# Patient Record
Sex: Male | Born: 1995 | Race: White | Hispanic: No | Marital: Single | Smoking: Never smoker
Health system: Southern US, Community
[De-identification: ages and names within clinical notes are randomized; demographics above are authoritative.]

## PROBLEM LIST (undated history)

## (undated) DIAGNOSIS — J45909 Unspecified asthma, uncomplicated: Secondary | ICD-10-CM

## (undated) DIAGNOSIS — D691 Qualitative platelet defects: Secondary | ICD-10-CM

---

## 2016-09-08 ENCOUNTER — Emergency Department
Admission: EM | Admit: 2016-09-08 | Discharge: 2016-09-08 | Disposition: A | Discharge status: Home / Self Care | Payer: 59 | Attending: Emergency Medicine | Admitting: Emergency Medicine

## 2016-09-08 ENCOUNTER — Encounter: Payer: Self-pay | Admitting: Emergency Medicine

## 2016-09-08 DIAGNOSIS — B349 Viral infection, unspecified: Secondary | ICD-10-CM | POA: Insufficient documentation

## 2016-09-08 DIAGNOSIS — J45909 Unspecified asthma, uncomplicated: Secondary | ICD-10-CM | POA: Diagnosis not present

## 2016-09-08 DIAGNOSIS — R42 Dizziness and giddiness: Secondary | ICD-10-CM | POA: Diagnosis present

## 2016-09-08 HISTORY — DX: Unspecified asthma, uncomplicated: J45.909

## 2016-09-08 HISTORY — DX: Qualitative platelet defects: D69.1

## 2016-09-08 MED ORDER — ACETAMINOPHEN 500 MG PO TABS
ORAL_TABLET | ORAL | Status: AC
  Filled 2016-09-08: qty 2

## 2016-09-08 MED ORDER — ONDANSETRON 4 MG PO TBDP
4.0000 mg | ORAL_TABLET | Freq: Once | ORAL | Status: AC
  Administered 2016-09-08: 4 mg via ORAL

## 2016-09-08 MED ORDER — ACETAMINOPHEN 500 MG PO TABS
1000.0000 mg | ORAL_TABLET | Freq: Once | ORAL | Status: AC
  Administered 2016-09-08: 1000 mg via ORAL

## 2016-09-08 MED ORDER — ONDANSETRON HCL 4 MG PO TABS: 4.0000 mg | ORAL_TABLET | Freq: Three times a day (TID) | ORAL | 0 refills | Status: DC | PRN

## 2016-09-08 MED ORDER — ONDANSETRON 4 MG PO TBDP
ORAL_TABLET | ORAL | Status: AC
  Filled 2016-09-08: qty 1

## 2016-09-08 NOTE — ED Triage Notes (Signed)
C/O dizziness, stomach pain, "burning throat" onset of symptoms today.

## 2016-09-08 NOTE — ED Provider Notes (Signed)
[facility] Emergency Department Provider Note   ____________________________________________   I have reviewed the triage vital signs and the nursing notes.   HISTORY  Chief Complaint Abdominal Pain and Dizziness   History limited by: Not Limited   HPI Antonio Kaufman is a 21 y.o. male who presents to the emergency department today because of concerns for some abdominal pain, dizziness, fevers. States the symptoms started this morning. There have been persistent throughout the day. Patient states that he has had a hard time keeping down any fluids because of nausea and vomiting. Patient denies any known sick contacts.   Past Medical History:  Diagnosis Date  . Asthma   . Platelet disorder (HCC)     There are no active problems to display for this patient.   History reviewed. No pertinent surgical history.  Prior to Admission medications   Not on File    Allergies Motrin [ibuprofen]  No family history on file.  Social History Social History  Substance Use Topics  . Smoking status: Never Smoker  . Smokeless tobacco: Never Used  . Alcohol use No    Review of Systems  Constitutional: Positive for fever. Cardiovascular: Negative for chest pain. Respiratory: Negative for shortness of breath. Gastrointestinal: Positive for abdominal pain. Neurological: Negative for headaches, focal weakness or numbness.  10-point ROS otherwise negative.  ____________________________________________   PHYSICAL EXAM:  VITAL SIGNS: ED Triage Vitals  Enc Vitals Group     BP 09/08/16 1845 132/76     Pulse Rate 09/08/16 1845 (!) 111     Resp 09/08/16 1845 18     Temp 09/08/16 1845 (!) 100.4 F (38 C)     Temp Source 09/08/16 1845 Oral     SpO2 --      Weight 09/08/16 1842 163 lb (73.9 kg)     Height 09/08/16 1842 6\' 2"  (1.88 m)     Head Circumference --      Peak Flow --      Pain Score 09/08/16 1843 4   Constitutional: Alert and oriented. Well  appearing and in no distress. Eyes: Conjunctivae are normal. Normal extraocular movements. ENT   Head: Normocephalic and atraumatic.   Nose: No congestion/rhinnorhea.   Mouth/Throat: Mucous membranes are moist.   Neck: No stridor. Hematological/Lymphatic/Immunilogical: No cervical lymphadenopathy. Cardiovascular: Tachycardic, regular rhythm.  No murmurs, rubs, or gallops.  Respiratory: Normal respiratory effort without tachypnea nor retractions. Breath sounds are clear and equal bilaterally. No wheezes/rales/rhonchi. Gastrointestinal: Soft and non tender. No rebound. No guarding.  Genitourinary: Deferred Musculoskeletal: Normal range of motion in all extremities. No lower extremity edema. Neurologic:  Normal speech and language. No gross focal neurologic deficits are appreciated.  Skin:  Skin is warm, dry and intact. No rash noted. Psychiatric: Mood and affect are normal. Speech and behavior are normal. Patient exhibits appropriate insight and judgment.  ____________________________________________    LABS (pertinent positives/negatives)  Labs Reviewed - No data to display   ____________________________________________   EKG  None  ____________________________________________    RADIOLOGY  None  ____________________________________________   PROCEDURES  Procedures  ____________________________________________   INITIAL IMPRESSION / ASSESSMENT AND PLAN / ED COURSE  Pertinent labs & imaging results that were available during my care of the patient were reviewed by me and considered in my medical decision making (see chart for details).  Patient presents to emergency department today for viral type illnesses. Patient was given medication here. Patient did defervesce. He was able to tolerate by mouth. She was  easily able to ambulatory around the emergency department. At this point the patient still slightly tachycardic given the fact that he is able to  tolerate by mouth I think he can continue to hydrate at home.  ____________________________________________   FINAL CLINICAL IMPRESSION(S) / ED DIAGNOSES  Final diagnoses:  Viral illness     Note: This dictation was prepared with Dragon dictation. Any transcriptional errors that result from this process are unintentional     Phineas SemenGraydon Lazarus [place], MD 09/08/16 2237

## 2016-09-08 NOTE — ED Notes (Signed)
Patient tolerating PO challenge well with no difficulty.

## 2016-09-08 NOTE — Discharge Instructions (Signed)
Please seek medical attention for any high fevers, chest pain, shortness of breath, change in behavior, persistent vomiting, bloody stool or any other new or concerning symptoms.  

## 2018-04-26 ENCOUNTER — Emergency Department
Admission: EM | Admit: 2018-04-26 | Discharge: 2018-04-26 | Disposition: A | Discharge status: Home / Self Care | Payer: 59 | Attending: Emergency Medicine | Admitting: Emergency Medicine

## 2018-04-26 ENCOUNTER — Other Ambulatory Visit: Payer: Self-pay

## 2018-04-26 DIAGNOSIS — J45909 Unspecified asthma, uncomplicated: Secondary | ICD-10-CM | POA: Diagnosis not present

## 2018-04-26 DIAGNOSIS — F410 Panic disorder [episodic paroxysmal anxiety] without agoraphobia: Secondary | ICD-10-CM

## 2018-04-26 LAB — COMPREHENSIVE METABOLIC PANEL
ALBUMIN: 5.4 g/dL — AB (ref 3.5–5.0)
ALK PHOS: 62 U/L (ref 38–126)
ALT: 19 U/L (ref 0–44)
ANION GAP: 11 (ref 5–15)
AST: 24 U/L (ref 15–41)
BUN: 9 mg/dL (ref 6–20)
CALCIUM: 9.8 mg/dL (ref 8.9–10.3)
CHLORIDE: 106 mmol/L (ref 98–111)
CO2: 24 mmol/L (ref 22–32)
Creatinine, Ser: 0.69 mg/dL (ref 0.61–1.24)
GFR calc Af Amer: >60 mL/min (ref >60)
GFR calc non Af Amer: >60 mL/min (ref >60)
GLUCOSE: 106 mg/dL — AB (ref 70–99)
Potassium: 3.9 mmol/L (ref 3.5–5.1)
SODIUM: 141 mmol/L (ref 135–145)
Total Bilirubin: 0.7 mg/dL (ref 0.3–1.2)
Total Protein: 7.9 g/dL (ref 6.5–8.1)

## 2018-04-26 LAB — CBC
HEMATOCRIT: 44.2 % (ref 40.0–52.0)
HEMOGLOBIN: 15.2 g/dL (ref 13.0–18.0)
MCH: 28.7 pg (ref 26.0–34.0)
MCHC: 34.3 g/dL (ref 32.0–36.0)
MCV: 83.6 fL (ref 80.0–100.0)
PLATELETS: 82 10*3/uL — AB (ref 150–440)
RBC: 5.29 MIL/uL (ref 4.40–5.90)
RDW: 14.8 % — ABNORMAL HIGH (ref 11.5–14.5)
WBC: 11.8 10*3/uL — ABNORMAL HIGH (ref 3.8–10.6)

## 2018-04-26 LAB — URINE DRUG SCREEN, QUALITATIVE (ARMC ONLY)
Amphetamines, Ur Screen: NOT DETECTED
Barbiturates, Ur Screen: NOT DETECTED
Benzodiazepine, Ur Scrn: NOT DETECTED
CANNABINOID 50 NG, UR ~~LOC~~: NOT DETECTED
COCAINE METABOLITE, UR ~~LOC~~: NOT DETECTED
MDMA (ECSTASY) UR SCREEN: NOT DETECTED
Methadone Scn, Ur: NOT DETECTED
Opiate, Ur Screen: NOT DETECTED
PHENCYCLIDINE (PCP) UR S: NOT DETECTED
Tricyclic, Ur Screen: NOT DETECTED

## 2018-04-26 LAB — ETHANOL: Alcohol, Ethyl (B): <10 mg/dL (ref <10)

## 2018-04-26 MED ORDER — LORAZEPAM 0.5 MG PO TABS
0.5000 mg | ORAL_TABLET | Freq: Once | ORAL | Status: AC
  Administered 2018-04-26: 0.5 mg via ORAL
  Filled 2018-04-26: qty 1

## 2018-04-26 NOTE — ED Notes (Signed)
Pt. Transferred from Triage to room after dressing out and screening for contraband. Pt. Oriented to Quad including Q15 minute rounds as well as Rover and Officer for their protection. Patient is alert and oriented, warm and dry in no acute distress. Patient denies SI, HI, and AVH. Pt. Encouraged to let me know if needs arise.  
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
## 2018-04-26 NOTE — Discharge Instructions (Addendum)
Please seek medical attention and help for any thoughts about wanting to harm yourself, harm others, any concerning change in behavior, severe depression, inappropriate drug use or any other new or concerning symptoms. ° °
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
## 2018-04-26 NOTE — ED Notes (Addendum)
Pt has wallet (denies having any money on him), cell phone, orange t-shirt, blue shorts, black tennis shoes, black socks, black underwear.  No jewelry.  Pt glasses are remaining with pt.   Very cooperative during dress out process.

## 2018-04-26 NOTE — ED Notes (Signed)
Hourly rounding reveals patient sitting on 20 hall bed. No complaints, stable, in no acute distress. Q15 minute rounds and monitoring via Psychologist, counselling to continue.

## 2019-11-17 ENCOUNTER — Other Ambulatory Visit: Payer: Self-pay

## 2019-11-17 DIAGNOSIS — Z20822 Contact with and (suspected) exposure to covid-19: Secondary | ICD-10-CM | POA: Diagnosis present

## 2019-11-17 DIAGNOSIS — D696 Thrombocytopenia, unspecified: Secondary | ICD-10-CM | POA: Diagnosis present

## 2019-11-17 DIAGNOSIS — Z833 Family history of diabetes mellitus: Secondary | ICD-10-CM

## 2019-11-17 DIAGNOSIS — R778 Other specified abnormalities of plasma proteins: Secondary | ICD-10-CM | POA: Diagnosis present

## 2019-11-17 DIAGNOSIS — I409 Acute myocarditis, unspecified: Principal | ICD-10-CM | POA: Diagnosis present

## 2019-11-17 DIAGNOSIS — J45909 Unspecified asthma, uncomplicated: Secondary | ICD-10-CM | POA: Diagnosis present

## 2019-11-18 ENCOUNTER — Inpatient Hospital Stay
Admit: 2019-11-18 | Discharge: 2019-11-18 | Disposition: A | Discharge status: Home / Self Care | Payer: 59 | Attending: Physician Assistant | Admitting: Physician Assistant

## 2019-11-18 ENCOUNTER — Emergency Department: Payer: 59

## 2019-11-18 ENCOUNTER — Inpatient Hospital Stay
Admission: EM | Admit: 2019-11-18 | Discharge: 2019-11-18 | DRG: 316 | Disposition: A | Discharge status: Other Acute Inpatient Hospital | Payer: 59 | Attending: Internal Medicine | Admitting: Internal Medicine

## 2019-11-18 ENCOUNTER — Other Ambulatory Visit: Payer: Self-pay

## 2019-11-18 ENCOUNTER — Encounter: Payer: Self-pay | Admitting: Emergency Medicine

## 2019-11-18 DIAGNOSIS — Z20822 Contact with and (suspected) exposure to covid-19: Secondary | ICD-10-CM | POA: Diagnosis present

## 2019-11-18 DIAGNOSIS — I409 Acute myocarditis, unspecified: Secondary | ICD-10-CM | POA: Diagnosis present

## 2019-11-18 DIAGNOSIS — R079 Chest pain, unspecified: Secondary | ICD-10-CM

## 2019-11-18 DIAGNOSIS — R778 Other specified abnormalities of plasma proteins: Secondary | ICD-10-CM | POA: Diagnosis present

## 2019-11-18 DIAGNOSIS — I400 Infective myocarditis: Secondary | ICD-10-CM | POA: Diagnosis not present

## 2019-11-18 DIAGNOSIS — D696 Thrombocytopenia, unspecified: Secondary | ICD-10-CM | POA: Diagnosis present

## 2019-11-18 DIAGNOSIS — J45909 Unspecified asthma, uncomplicated: Secondary | ICD-10-CM

## 2019-11-18 DIAGNOSIS — Z833 Family history of diabetes mellitus: Secondary | ICD-10-CM | POA: Diagnosis not present

## 2019-11-18 LAB — COMPREHENSIVE METABOLIC PANEL
ALT: 21 U/L (ref 0–44)
AST: 31 U/L (ref 15–41)
Albumin: 4.7 g/dL (ref 3.5–5.0)
Alkaline Phosphatase: 67 U/L (ref 38–126)
Anion gap: 11 (ref 5–15)
BUN: 13 mg/dL (ref 6–20)
CO2: 27 mmol/L (ref 22–32)
Calcium: 9.5 mg/dL (ref 8.9–10.3)
Chloride: 100 mmol/L (ref 98–111)
Creatinine, Ser: 0.89 mg/dL (ref 0.61–1.24)
GFR calc Af Amer: >60 mL/min (ref >60)
GFR calc non Af Amer: >60 mL/min (ref >60)
Glucose, Bld: 115 mg/dL — ABNORMAL HIGH (ref 70–99)
Potassium: 3.7 mmol/L (ref 3.5–5.1)
Sodium: 138 mmol/L (ref 135–145)
Total Bilirubin: 0.6 mg/dL (ref 0.3–1.2)
Total Protein: 7.6 g/dL (ref 6.5–8.1)

## 2019-11-18 LAB — BASIC METABOLIC PANEL
Anion gap: 8 (ref 5–15)
BUN: 10 mg/dL (ref 6–20)
CO2: 27 mmol/L (ref 22–32)
Calcium: 9.1 mg/dL (ref 8.9–10.3)
Chloride: 104 mmol/L (ref 98–111)
Creatinine, Ser: 0.77 mg/dL (ref 0.61–1.24)
GFR calc Af Amer: >60 mL/min (ref >60)
GFR calc non Af Amer: >60 mL/min (ref >60)
Glucose, Bld: 133 mg/dL — ABNORMAL HIGH (ref 70–99)
Potassium: 3.7 mmol/L (ref 3.5–5.1)
Sodium: 139 mmol/L (ref 135–145)

## 2019-11-18 LAB — LIPID PANEL
Cholesterol: 151 mg/dL (ref 0–200)
HDL: 57 mg/dL (ref >40)
LDL Cholesterol: 87 mg/dL (ref 0–99)
Total CHOL/HDL Ratio: 2.6 RATIO
Triglycerides: 33 mg/dL (ref <150)
VLDL: 7 mg/dL (ref 0–40)

## 2019-11-18 LAB — CBC WITH DIFFERENTIAL/PLATELET
Abs Immature Granulocytes: 0.04 10*3/uL (ref 0.00–0.07)
Basophils Absolute: 0 10*3/uL (ref 0.0–0.1)
Basophils Relative: 0 %
Eosinophils Absolute: 0.1 10*3/uL (ref 0.0–0.5)
Eosinophils Relative: 1 %
HCT: 39.9 % (ref 39.0–52.0)
Hemoglobin: 13.2 g/dL (ref 13.0–17.0)
Immature Granulocytes: 0 %
Lymphocytes Relative: 23 %
Lymphs Abs: 2.3 10*3/uL (ref 0.7–4.0)
MCH: 28.2 pg (ref 26.0–34.0)
MCHC: 33.1 g/dL (ref 30.0–36.0)
MCV: 85.3 fL (ref 80.0–100.0)
Monocytes Absolute: 0.9 10*3/uL (ref 0.1–1.0)
Monocytes Relative: 9 %
Neutro Abs: 6.6 10*3/uL (ref 1.7–7.7)
Neutrophils Relative %: 67 %
Platelets: 80 10*3/uL — ABNORMAL LOW (ref 150–400)
RBC: 4.68 MIL/uL (ref 4.22–5.81)
RDW: 13.5 % (ref 11.5–15.5)
WBC: 10 10*3/uL (ref 4.0–10.5)
nRBC: 0 % (ref 0.0–0.2)

## 2019-11-18 LAB — CBC
HCT: 38.9 % — ABNORMAL LOW (ref 39.0–52.0)
Hemoglobin: 12.9 g/dL — ABNORMAL LOW (ref 13.0–17.0)
MCH: 28.3 pg (ref 26.0–34.0)
MCHC: 33.2 g/dL (ref 30.0–36.0)
MCV: 85.3 fL (ref 80.0–100.0)
Platelets: 81 10*3/uL — ABNORMAL LOW (ref 150–400)
RBC: 4.56 MIL/uL (ref 4.22–5.81)
RDW: 13.7 % (ref 11.5–15.5)
WBC: 11.7 10*3/uL — ABNORMAL HIGH (ref 4.0–10.5)
nRBC: 0 % (ref 0.0–0.2)

## 2019-11-18 LAB — ECHOCARDIOGRAM COMPLETE
Height: 74 in
Weight: 3039.98 oz

## 2019-11-18 LAB — URINE DRUG SCREEN, QUALITATIVE (ARMC ONLY)
Amphetamines, Ur Screen: NOT DETECTED
Barbiturates, Ur Screen: NOT DETECTED
Benzodiazepine, Ur Scrn: NOT DETECTED
Cannabinoid 50 Ng, Ur ~~LOC~~: NOT DETECTED
Cocaine Metabolite,Ur ~~LOC~~: NOT DETECTED
MDMA (Ecstasy)Ur Screen: NOT DETECTED
Methadone Scn, Ur: NOT DETECTED
Opiate, Ur Screen: POSITIVE — AB
Phencyclidine (PCP) Ur S: NOT DETECTED
Tricyclic, Ur Screen: NOT DETECTED

## 2019-11-18 LAB — PROTIME-INR
INR: 1 (ref 0.8–1.2)
Prothrombin Time: 13.4 seconds (ref 11.4–15.2)

## 2019-11-18 LAB — CULTURE, BLOOD (ROUTINE X 2): Culture: NO GROWTH

## 2019-11-18 LAB — TROPONIN I (HIGH SENSITIVITY)
Troponin I (High Sensitivity): 2554 ng/L (ref <18)
Troponin I (High Sensitivity): 3953 ng/L (ref <18)
Troponin I (High Sensitivity): 6726 ng/L (ref <18)
Troponin I (High Sensitivity): 7452 ng/L (ref <18)

## 2019-11-18 LAB — HEPARIN LEVEL (UNFRACTIONATED)
Heparin Unfractionated: 0.6 IU/mL (ref 0.30–0.70)
Heparin Unfractionated: 1.61 IU/mL — ABNORMAL HIGH (ref 0.30–0.70)

## 2019-11-18 LAB — RESPIRATORY PANEL BY RT PCR (FLU A&B, COVID)
Influenza A by PCR: NEGATIVE
Influenza B by PCR: NEGATIVE
SARS Coronavirus 2 by RT PCR: NEGATIVE

## 2019-11-18 LAB — C-REACTIVE PROTEIN: CRP: 2.2 mg/dL — ABNORMAL HIGH (ref <1.0)

## 2019-11-18 LAB — HIV ANTIBODY (ROUTINE TESTING W REFLEX): HIV Screen 4th Generation wRfx: NONREACTIVE

## 2019-11-18 LAB — APTT: aPTT: 26 seconds (ref 24–36)

## 2019-11-18 MED ORDER — MORPHINE SULFATE (PF) 2 MG/ML IV SOLN
1.0000 mg | Freq: Once | INTRAVENOUS | Status: AC
  Administered 2019-11-18: 1 mg via INTRAVENOUS
  Filled 2019-11-18: qty 1

## 2019-11-18 MED ORDER — ALPRAZOLAM 0.5 MG PO TABS: 0.2500 mg | ORAL_TABLET | Freq: Two times a day (BID) | ORAL | Status: DC | PRN

## 2019-11-18 MED ORDER — VANCOMYCIN HCL 2000 MG/400ML IV SOLN
2000.0000 mg | Freq: Once | INTRAVENOUS | Status: AC
  Administered 2019-11-18: 2000 mg via INTRAVENOUS
  Filled 2019-11-18: qty 400

## 2019-11-18 MED ORDER — HEPARIN BOLUS VIA INFUSION
4000.0000 [IU] | Freq: Once | INTRAVENOUS | Status: AC
  Administered 2019-11-18: 4000 [IU] via INTRAVENOUS
  Filled 2019-11-18: qty 4000

## 2019-11-18 MED ORDER — SODIUM CHLORIDE 0.9 % IV SOLN
2.0000 g | INTRAVENOUS | Status: DC
  Administered 2019-11-18: 2 g via INTRAVENOUS
  Filled 2019-11-18: qty 20

## 2019-11-18 MED ORDER — ONDANSETRON HCL 4 MG/2ML IJ SOLN: 4.0000 mg | Freq: Four times a day (QID) | INTRAMUSCULAR | Status: DC | PRN

## 2019-11-18 MED ORDER — COLCHICINE 0.6 MG PO TABS
0.60 | ORAL_TABLET | ORAL | Status: DC
Start: 2019-11-19 — End: 2019-11-18

## 2019-11-18 MED ORDER — ACETAMINOPHEN 325 MG PO TABS
650.0000 mg | ORAL_TABLET | ORAL | Status: DC | PRN
  Administered 2019-11-18: 650 mg via ORAL
  Filled 2019-11-18: qty 2

## 2019-11-18 MED ORDER — ZOLPIDEM TARTRATE 5 MG PO TABS: 5.0000 mg | ORAL_TABLET | Freq: Every evening | ORAL | Status: DC | PRN

## 2019-11-18 MED ORDER — ASPIRIN EC 81 MG PO TBEC: 81.0000 mg | DELAYED_RELEASE_TABLET | Freq: Every day | ORAL | Status: DC

## 2019-11-18 MED ORDER — MORPHINE SULFATE (PF) 2 MG/ML IV SOLN
INTRAVENOUS | Status: AC
  Filled 2019-11-18: qty 1

## 2019-11-18 MED ORDER — HEPARIN (PORCINE) 25000 UT/250ML-% IV SOLN
1200.0000 [IU]/h | INTRAVENOUS | Status: DC
  Administered 2019-11-18: 1200 [IU]/h via INTRAVENOUS
  Filled 2019-11-18: qty 250

## 2019-11-18 MED ORDER — IOHEXOL 350 MG/ML SOLN
75.0000 mL | Freq: Once | INTRAVENOUS | Status: AC | PRN
Start: 2019-11-18 — End: 2019-11-18
  Administered 2019-11-18: 75 mL via INTRAVENOUS

## 2019-11-18 MED ORDER — SODIUM CHLORIDE 0.9 % IV SOLN: INTRAVENOUS | Status: DC

## 2019-11-18 MED ORDER — MORPHINE SULFATE (PF) 4 MG/ML IV SOLN
4.0000 mg | Freq: Once | INTRAVENOUS | Status: AC
  Administered 2019-11-18: 4 mg via INTRAVENOUS

## 2019-11-18 MED ORDER — ATORVASTATIN CALCIUM 20 MG PO TABS
40.0000 mg | ORAL_TABLET | Freq: Every day | ORAL | Status: DC
  Administered 2019-11-18: 40 mg via ORAL
  Filled 2019-11-18: qty 2

## 2019-11-18 MED ORDER — VANCOMYCIN HCL 2000 MG/400ML IV SOLN
2000.0000 mg | Freq: Two times a day (BID) | INTRAVENOUS | Status: DC
  Filled 2019-11-18 (×2): qty 400

## 2019-11-18 MED ORDER — MORPHINE SULFATE (PF) 4 MG/ML IV SOLN
INTRAVENOUS | Status: AC
  Filled 2019-11-18: qty 1

## 2019-11-18 MED ORDER — PANTOPRAZOLE SODIUM 40 MG PO TBEC
40.0000 mg | DELAYED_RELEASE_TABLET | Freq: Every day | ORAL | Status: DC
  Administered 2019-11-18: 40 mg via ORAL
  Filled 2019-11-18: qty 1

## 2019-11-18 MED ORDER — LIDOCAINE HCL 1 % IJ SOLN
0.50 | INTRAMUSCULAR | Status: DC
Start: ? — End: 2019-11-18

## 2019-11-18 MED ORDER — MORPHINE SULFATE (PF) 2 MG/ML IV SOLN
1.0000 mg | Freq: Once | INTRAVENOUS | Status: AC
  Administered 2019-11-18: 1 mg via INTRAVENOUS

## 2019-11-18 MED ORDER — MORPHINE SULFATE (PF) 2 MG/ML IV SOLN
2.0000 mg | Freq: Once | INTRAVENOUS | Status: AC
  Administered 2019-11-18: 2 mg via INTRAVENOUS
  Filled 2019-11-18: qty 1

## 2019-11-18 MED ORDER — SODIUM CHLORIDE 0.9 % IV SOLN
1.0000 g | Freq: Once | INTRAVENOUS | Status: AC
  Administered 2019-11-18: 1 g via INTRAVENOUS
  Filled 2019-11-18: qty 10

## 2019-11-18 MED ORDER — METHYLPREDNISOLONE SODIUM SUCC 40 MG IJ SOLR
40.0000 mg | Freq: Three times a day (TID) | INTRAMUSCULAR | Status: DC
  Administered 2019-11-18: 40 mg via INTRAVENOUS
  Filled 2019-11-18: qty 1

## 2019-11-18 MED ORDER — HEPARIN (PORCINE) 25000 UT/250ML-% IV SOLN: 1050.0000 [IU]/h | INTRAVENOUS | Status: DC

## 2019-11-18 MED ORDER — METHYLPREDNISOLONE SODIUM SUCC 125 MG IJ SOLR
125.0000 mg | Freq: Once | INTRAMUSCULAR | Status: AC
  Administered 2019-11-18: 125 mg via INTRAVENOUS
  Filled 2019-11-18: qty 2

## 2019-11-18 MED ORDER — NITROGLYCERIN 0.4 MG SL SUBL: 0.4000 mg | SUBLINGUAL_TABLET | SUBLINGUAL | Status: DC | PRN

## 2019-11-18 NOTE — Progress Notes (Signed)
Physician Discharge Summary  [name] LOV:[id number] DOB: 04/15/1996 DOA: 11/18/2019  PCP: System, Pcp Not In  Admit date: 11/18/2019 Discharge date: 11/18/2019  Admitted From: Home Disposition:  [facility]   Discharge Condition:Stable CODE STATUS:FULL Diet recommendation: Heart Healthy  Brief/Interim Summary: HPI: Antonio Kaufman  is a 24 y.o. Caucasian pleasant male with a known history of controlled asthma and chronic thrombocytopenia, presented to the emergency room with acute onset of chest pain in the midsternal area that started at 6 AM and associated left arm pain started 7:30 PM.  He also admitted to neck pain since yesterday.  He felt he had a fever will not check it and denies any cough wheezing.  He admits to dyspnea without palpitations.  He just had his second [employer] vaccine on Sunday and denied any COVID-19 recent exposure.  He stated he had several side effects with that including dizziness, headache, migraine, cough, unsteady gait and balance loss as well as fever and chills with diaphoresis.  Upon presentation to the emergency room, vital signs were within normal.  Labs were remarkable for high-sensitivity troponin I of 2554 and later 3953 with normal CMP and CBC except for thrombocytopenia of 80 comparable to previous levels.  COVID-19 PCR and influenza antigens came back all negative.  2 blood cultures were drawn.  Two-view chest x-ray showed no acute cardiopulmonary disease.  Chest CTA revealed no evidence for PE or acute cardiopulmonary disease.  The patient was given IV Rocephin and vancomycin as well as 2 mg of IV morphine sulfate and hematology consultation was obtained by Dr. Cathie Hoops over the phone who was agreeable with IV heparin bolus and drip.  The patient will be admitted to a progressive cardiac unit bed for further evaluation and management.  Update: The patient was seen and evaluated by cardiology consultant at [facility]  Echocardiogram reviewed.  No cardiac catheterization recommended at this time.  Cardiology consult and discussed the case with cardiologist at [facility].  Recommending transfer to [facility] for advanced cardiac work-up including cardiac MRI to assess degree of inflammation.  Working diagnosis is myocarditis versus myopericarditis.  Cardiology service to arrange for transfer.  Accepting physician at [facility] will be Dr. Ardyth Harps.    Discharge Diagnoses:  Active Problems:   Acute myocarditis  Acute myopericarditis Suspect viral etiology Unable to exclude bacterial etiology at this time Less likely Covid vaccine effect Patient was started on IV steroids in the ED Supplemental pain control with narcotics On IV Rocephin and IV vancomycin 2D echocardiogram completed Troponins trended, last troponin 7400 Transfer to [facility] for advanced cardiac work-up and cardiac MRI  Asthma Stable Not acutely exacerbated  Thrombocytopenia Chronic known issue No bleeding noted Monitor platelets  Discharge Instructions     Allergies  Allergen Reactions  . Motrin [Ibuprofen] Other (See Comments)    Low platlets    Consultations:  Cardiology- [facility]   Procedures/Studies: DG Chest 2 View  Result Date: 11/18/2019 CLINICAL DATA:  Mid chest pain and shortness of breath. EXAM: CHEST - 2 VIEW COMPARISON:  None. FINDINGS: The cardiomediastinal contours are normal. The lungs are clear. Pulmonary vasculature is normal. No consolidation, pleural effusion, or pneumothorax. No acute osseous abnormalities are seen. IMPRESSION: Negative radiographs of the chest. Electronically Signed   By: Narda Rutherford M.D.   On: 11/18/2019 00:25   CT Angio Chest PE W and/or Wo Contrast  Result Date: 11/18/2019 CLINICAL DATA:  Chest pain, shortness  of breath EXAM: CT ANGIOGRAPHY CHEST WITH CONTRAST TECHNIQUE: Multidetector CT imaging of the chest was performed  using the standard protocol during bolus administration of intravenous contrast. Multiplanar CT image reconstructions and MIPs were obtained to evaluate the vascular anatomy. CONTRAST:  75mL OMNIPAQUE IOHEXOL 350 MG/ML SOLN COMPARISON:  None. FINDINGS: Cardiovascular: No filling defects in the pulmonary arteries. Heart is normal size. Aorta is normal caliber. Mediastinum/Nodes: No mediastinal, hilar, or axillary adenopathy. Trachea and esophagus are unremarkable. Thyroid unremarkable. Lungs/Pleura: Lungs are clear. No focal airspace opacities or suspicious nodules. No effusions. Upper Abdomen: Imaging into the upper abdomen shows no acute findings. Musculoskeletal: Chest wall soft tissues are unremarkable. No acute bony abnormality. Review of the MIP images confirms the above findings. IMPRESSION: No acute cardiopulmonary disease. No evidence of pulmonary embolus. Electronically Signed   By: Charlett Nose M.D.   On: 11/18/2019 01:55   ECHOCARDIOGRAM COMPLETE  Result Date: 11/18/2019    ECHOCARDIOGRAM REPORT   Patient Name:   Antonio Kaufman Date of Exam: 11/18/2019 Medical Rec #:  [id number]     Height:       74.0 in Accession #:    [id number]    Weight:       190.0 lb Date of Birth:  1996/05/02      BSA:          2.127 m Patient Age:    24 years      BP:           123/94 mmHg Patient Gender: M             HR:           98 bpm. Exam Location:  [facility] Procedure: 2D Echo, Cardiac Doppler and Color Doppler Indications:     Chest pain 786.50 Elevated troponin R 07.9  History:         Patient has no prior history of Echocardiogram examinations. No                  cardiac history in chart.  Sonographer:     Cristela Blue RDCS (AE) Referring Phys:  [id number] Leanora Ivanoff Diagnosing Phys: Marcina Millard MD IMPRESSIONS  1. Left ventricular ejection fraction, by estimation, is 60 to 65%. The left ventricle has normal function. The left ventricle has no regional wall motion abnormalities. Left ventricular diastolic parameters are  consistent with Grade I diastolic dysfunction (impaired relaxation).  2. Right ventricular systolic function is normal. The right ventricular size is normal. There is normal pulmonary artery systolic pressure.  3. The mitral valve is normal in structure. Mild mitral valve regurgitation. No evidence of mitral stenosis.  4. The aortic valve is normal in structure. Aortic valve regurgitation is not visualized. No aortic stenosis is present.  5. The inferior vena cava is normal in size with greater than 50% respiratory variability, suggesting right atrial pressure of 3 mmHg. FINDINGS  Left Ventricle: Left ventricular ejection fraction, by estimation, is 60 to 65%. The left ventricle has normal function. The left ventricle has no regional wall motion abnormalities. The left ventricular internal cavity size was normal in size. There is  borderline left ventricular hypertrophy. Left ventricular diastolic parameters are consistent with Grade I diastolic dysfunction (impaired relaxation). Right Ventricle: The right ventricular size is normal. No increase in right ventricular wall thickness. Right ventricular systolic function is normal. There is normal pulmonary artery systolic pressure. The tricuspid regurgitant velocity is 1.28 m/s, and  with an assumed right atrial pressure of 10  mmHg, the estimated right ventricular systolic pressure is 16.6 mmHg. Left Atrium: Left atrial size was normal in size. Right Atrium: Right atrial size was normal in size. Pericardium: There is no evidence of pericardial effusion. Mitral Valve: The mitral valve is normal in structure. Normal mobility of the mitral valve leaflets. Mild mitral valve regurgitation. No evidence of mitral valve stenosis. Tricuspid Valve: The tricuspid valve is normal in structure. Tricuspid valve regurgitation is trivial. No evidence of tricuspid stenosis. Aortic Valve: The aortic valve is normal in structure. Aortic valve regurgitation is not visualized. No aortic  stenosis is present. Aortic valve mean gradient measures 1.5 mmHg. Aortic valve peak gradient measures 3.1 mmHg. Aortic valve area, by VTI measures 3.43 cm. Pulmonic Valve: The pulmonic valve was normal in structure. Pulmonic valve regurgitation is not visualized. No evidence of pulmonic stenosis. Aorta: The aortic root is normal in size and structure. Venous: The inferior vena cava is normal in size with greater than 50% respiratory variability, suggesting right atrial pressure of 3 mmHg. IAS/Shunts: No atrial level shunt detected by color flow Doppler.  LEFT VENTRICLE PLAX 2D LVIDd:         4.48 cm  Diastology LVIDs:         2.86 cm  LV e' lateral:   8.05 cm/s LV PW:         1.24 cm  LV E/e' lateral: 9.1 LV IVS:        0.98 cm  LV e' medial:    9.14 cm/s LVOT diam:     2.10 cm  LV E/e' medial:  8.0 LV SV:         49 LV SV Index:   23 LVOT Area:     3.46 cm  RIGHT VENTRICLE RV S prime:     15.60 cm/s TAPSE (M-mode): 3.1 cm LEFT ATRIUM             Index       RIGHT ATRIUM           Index LA diam:        2.70 cm 1.27 cm/m  RA Area:     13.50 cm LA Vol (A2C):   43.8 ml 20.60 ml/m RA Volume:   35.20 ml  16.55 ml/m LA Vol (A4C):   23.1 ml 10.86 ml/m LA Biplane Vol: 32.8 ml 15.42 ml/m  AORTIC VALVE                   PULMONIC VALVE AV Area (Vmax):    3.48 cm    PV Vmax:        1.00 m/s AV Area (Vmean):   3.28 cm    PV Peak grad:   4.0 mmHg AV Area (VTI):     3.43 cm    RVOT Peak grad: 4 mmHg AV Vmax:           87.50 cm/s AV Vmean:          61.900 cm/s AV VTI:            0.144 m AV Peak Grad:      3.1 mmHg AV Mean Grad:      1.5 mmHg LVOT Vmax:         87.90 cm/s LVOT Vmean:        58.700 cm/s LVOT VTI:          0.142 m LVOT/AV VTI ratio: 0.99  AORTA Ao Root diam: 3.40 cm MITRAL VALVE  TRICUSPID VALVE MV Area (PHT): 9.85 cm    TR Peak grad:   6.6 mmHg MV Decel Time: 77 msec     TR Vmax:        128.00 cm/s MV E velocity: 73.20 cm/s MV A velocity: 83.90 cm/s  SHUNTS MV E/A ratio:  0.87         Systemic VTI:  0.14 m                            Systemic Diam: 2.10 cm Marcina Millard MD Electronically signed by Marcina Millard MD Signature Date/Time: 11/18/2019/12:36:19 PM    Final     (Echo, Carotid, EGD, Colonoscopy, ERCP)    Subjective: Seen and examined Still with some chest pain Concerned about cardiac damage  Discharge Exam: Vitals:   11/18/19 1130 11/18/19 1215  BP: 118/83   Pulse: 98 (!) 101  Resp: 18 (!) 24  Temp:    SpO2: 96% 97%   Vitals:   11/18/19 1030 11/18/19 1100 11/18/19 1130 11/18/19 1215  BP: (!) 123/94 123/84 118/83   Pulse: 98 (!) 106 98 (!) 101  Resp: (!) 21 18 18  (!) 24  Temp:      TempSrc:      SpO2: 98% 98% 96% 97%  Weight:      Height:        General: Pt is alert, awake, not in acute distress Cardiovascular: RRR, S1/S2 +, no rubs, no gallops Respiratory: CTA bilaterally, no wheezing, no rhonchi Abdominal: Soft, NT, ND, bowel sounds + Extremities: no edema, no cyanosis    The results of significant diagnostics from this hospitalization (including imaging, microbiology, ancillary and laboratory) are listed below for reference.     Microbiology: Recent Results (from the past 240 hour(s))  Blood culture (routine x 2)     Status: None (Preliminary result)   Collection Time: 11/18/19  1:09 AM   Specimen: BLOOD  Result Value Ref Range Status   Specimen Description BLOOD LAC  Final   Special Requests BOTTLES DRAWN AEROBIC AND ANAEROBIC BCAV  Final   Culture   Final    NO GROWTH < 12 HOURS Performed at [facility], [address] [place], [place]    Report Status PENDING  Incomplete  Blood culture (routine x 2)     Status: None (Preliminary result)   Collection Time: 11/18/19  1:09 AM   Specimen: BLOOD  Result Value Ref Range Status   Specimen Description BLOOD L HAND  Final   Special Requests BOTTLES DRAWN AEROBIC AND ANAEROBIC BCAV  Final   Culture   Final    NO GROWTH < 12 HOURS Performed at  [facility], [address], [place], [place]    Report Status PENDING  Incomplete  Respiratory Panel by RT PCR (Flu A&B, Covid) - Nasopharyngeal Swab     Status: None   Collection Time: 11/18/19  2:20 AM   Specimen: Nasopharyngeal Swab  Result Value Ref Range Status   SARS Coronavirus 2 by RT PCR NEGATIVE NEGATIVE Final    Comment: (NOTE) SARS-CoV-2 target nucleic acids are NOT DETECTED. The SARS-CoV-2 RNA is generally detectable in upper respiratoy specimens during the acute phase of infection. The lowest concentration of SARS-CoV-2 viral copies this assay can detect is 131 copies/mL. A negative result does not preclude SARS-Cov-2 infection and should not be used as the sole basis for treatment or other patient management decisions. A  negative result may occur with  improper specimen collection/handling, submission of specimen other than nasopharyngeal swab, presence of viral mutation(s) within the areas targeted by this assay, and inadequate number of viral copies (<131 copies/mL). A negative result must be combined with clinical observations, patient history, and epidemiological information. The expected result is Negative. Fact Sheet for Patients:  https://www.moore.com/ Fact Sheet for Healthcare Providers:  https://www.young.biz/ This test is not yet ap proved or cleared by the [place] FDA and  has been authorized for detection and/or diagnosis of SARS-CoV-2 by FDA under an Emergency Use Authorization (EUA). This EUA will remain  in effect (meaning this test can be used) for the duration of the COVID-19 declaration under Section 564(b)(1) of the Act, 21 U.S.C. section 360bbb-3(b)(1), unless the authorization is terminated or revoked sooner.    Influenza A by PCR NEGATIVE NEGATIVE Final   Influenza B by PCR NEGATIVE NEGATIVE Final    Comment: (NOTE) The Xpert Xpress SARS-CoV-2/FLU/RSV assay is intended as an  aid in  the diagnosis of influenza from Nasopharyngeal swab specimens and  should not be used as a sole basis for treatment. Nasal washings and  aspirates are unacceptable for Xpert Xpress SARS-CoV-2/FLU/RSV  testing. Fact Sheet for Patients: https://www.moore.com/ Fact Sheet for Healthcare Providers: https://www.young.biz/ This test is not yet approved or cleared by the [place] FDA and  has been authorized for detection and/or diagnosis of SARS-CoV-2 by  FDA under an Emergency Use Authorization (EUA). This EUA will remain  in effect (meaning this test can be used) for the duration of the  Covid-19 declaration under Section 564(b)(1) of the Act, 21  U.S.C. section 360bbb-3(b)(1), unless the authorization is  terminated or revoked. Performed at [facility], [address] [place], [place] [address]      Labs: BNP (last 3 results) No results for input(s): BNP in the last 8760 hours. Basic Metabolic Panel: Recent Labs  Lab 11/17/19 2359 11/18/19 0442  NA 138 139  K 3.7 3.7  CL 100 104  CO2 27 27  GLUCOSE 115* 133*  BUN 13 10  CREATININE 0.89 0.77  CALCIUM 9.5 9.1   Liver Function Tests: Recent Labs  Lab 11/17/19 2359  AST 31  ALT 21  ALKPHOS 67  BILITOT 0.6  PROT 7.6  ALBUMIN 4.7   No results for input(s): LIPASE, AMYLASE in the last 168 hours. No results for input(s): AMMONIA in the last 168 hours. CBC: Recent Labs  Lab 11/17/19 2359 11/18/19 0442  WBC 10.0 11.7*  NEUTROABS 6.6  --   HGB 13.2 12.9*  HCT 39.9 38.9*  MCV 85.3 85.3  PLT 80* 81*   Cardiac Enzymes: No results for input(s): CKTOTAL, CKMB, CKMBINDEX, TROPONINI in the last 168 hours. BNP: Invalid input(s): POCBNP CBG: No results for input(s): GLUCAP in the last 168 hours. D-Dimer No results for input(s): DDIMER in the last 72 hours. Hgb A1c No results for input(s): HGBA1C in the last 72 hours. Lipid Profile Recent Labs     11/18/19 0442  CHOL 151  HDL 57  LDLCALC 87  TRIG 33  CHOLHDL 2.6   Thyroid function studies No results for input(s): TSH, T4TOTAL, T3FREE, THYROIDAB in the last 72 hours.  Invalid input(s): FREET3 Anemia work up No results for input(s): VITAMINB12, FOLATE, FERRITIN, TIBC, IRON, RETICCTPCT in the last 72 hours. Urinalysis No results found for: COLORURINE, APPEARANCEUR, LABSPEC, PHURINE, GLUCOSEU, HGBUR, BILIRUBINUR, KETONESUR, PROTEINUR, UROBILINOGEN, NITRITE, LEUKOCYTESUR Sepsis Labs Invalid input(s): PROCALCITONIN,  WBC,  LACTICIDVEN  Microbiology Recent Results (from the past 240 hour(s))  Blood culture (routine x 2)     Status: None (Preliminary result)   Collection Time: 11/18/19  1:09 AM   Specimen: BLOOD  Result Value Ref Range Status   Specimen Description BLOOD LAC  Final   Special Requests BOTTLES DRAWN AEROBIC AND ANAEROBIC BCAV  Final   Culture   Final    NO GROWTH < 12 HOURS Performed at [facility], [address] [place], [place] [address]    Report Status PENDING  Incomplete  Blood culture (routine x 2)     Status: None (Preliminary result)   Collection Time: 11/18/19  1:09 AM   Specimen: BLOOD  Result Value Ref Range Status   Specimen Description BLOOD L HAND  Final   Special Requests BOTTLES DRAWN AEROBIC AND ANAEROBIC BCAV  Final   Culture   Final    NO GROWTH < 12 HOURS Performed at [facility], [address], [place], [place] [address]    Report Status PENDING  Incomplete  Respiratory Panel by RT PCR (Flu A&B, Covid) - Nasopharyngeal Swab     Status: None   Collection Time: 11/18/19  2:20 AM   Specimen: Nasopharyngeal Swab  Result Value Ref Range Status   SARS Coronavirus 2 by RT PCR NEGATIVE NEGATIVE Final    Comment: (NOTE) SARS-CoV-2 target nucleic acids are NOT DETECTED. The SARS-CoV-2 RNA is generally detectable in upper respiratoy specimens during the acute phase of infection. The lowest concentration of SARS-CoV-2  viral copies this assay can detect is 131 copies/mL. A negative result does not preclude SARS-Cov-2 infection and should not be used as the sole basis for treatment or other patient management decisions. A negative result may occur with  improper specimen collection/handling, submission of specimen other than nasopharyngeal swab, presence of viral mutation(s) within the areas targeted by this assay, and inadequate number of viral copies (<131 copies/mL). A negative result must be combined with clinical observations, patient history, and epidemiological information. The expected result is Negative. Fact Sheet for Patients:  PinkCheek.be Fact Sheet for Healthcare Providers:  GravelBags.it This test is not yet ap proved or cleared by the [place] FDA and  has been authorized for detection and/or diagnosis of SARS-CoV-2 by FDA under an Emergency Use Authorization (EUA). This EUA will remain  in effect (meaning this test can be used) for the duration of the COVID-19 declaration under Section 564(b)(1) of the Act, 21 U.S.C. section 360bbb-3(b)(1), unless the authorization is terminated or revoked sooner.    Influenza A by PCR NEGATIVE NEGATIVE Final   Influenza B by PCR NEGATIVE NEGATIVE Final    Comment: (NOTE) The Xpert Xpress SARS-CoV-2/FLU/RSV assay is intended as an aid in  the diagnosis of influenza from Nasopharyngeal swab specimens and  should not be used as a sole basis for treatment. Nasal washings and  aspirates are unacceptable for Xpert Xpress SARS-CoV-2/FLU/RSV  testing. Fact Sheet for Patients: PinkCheek.be Fact Sheet for Healthcare Providers: GravelBags.it This test is not yet approved or cleared by the [place] FDA and  has been authorized for detection and/or diagnosis of SARS-CoV-2 by  FDA under an Emergency Use Authorization (EUA). This EUA will  remain  in effect (meaning this test can be used) for the duration of the  Covid-19 declaration under Section 564(b)(1) of the Act, 21  U.S.C. section 360bbb-3(b)(1), unless the authorization is  terminated or revoked. Performed at [facility], [place], [place]  [address]      Time coordinating discharge: Over 30 minutes  SIGNED:   Tresa Moore, MD  [facility] 11/18/2019, 1:41 PM Pager   If 7PM-7AM, please contact night-coverage

## 2019-11-18 NOTE — Progress Notes (Addendum)
ANTICOAGULATION CONSULT NOTE - Initial Consult  Pharmacy Consult for heparin Indication: chest pain/ACS  Allergies  Allergen Reactions  . Motrin [Ibuprofen] Other (See Comments)    Low platlets    Patient Measurements: Height: 6\' 2"  (188 cm) Weight: 86.2 kg (190 lb) IBW/kg (Calculated) : 82.2 Heparin Dosing Weight: 86.2 kg  Vital Signs: BP: 118/83 (04/22 1130) Pulse Rate: 108 (04/22 1400)  Labs: Recent Labs    11/17/19 2359 11/17/19 2359 11/18/19 0109 11/18/19 0220 11/18/19 0301 11/18/19 0442 11/18/19 0725 11/18/19 1446  HGB 13.2  --   --   --   --  12.9*  --   --   HCT 39.9  --   --   --   --  38.9*  --   --   PLT 80*  --   --   --   --  81*  --   --   APTT  --   --   --  26  --   --   --   --   LABPROT  --   --   --  13.4  --   --   --   --   INR  --   --   --  1.0  --   --   --   --   HEPARINUNFRC  --   --   --   --   --   --  0.60 1.61*  CREATININE 0.89  --   --   --   --  0.77  --   --   TROPONINIHS 2,554*   < > 3,953*  --  6,726* 7,452*  --   --    < > = values in this interval not displayed.    Estimated Creatinine Clearance: 167 mL/min (by C-G formula based on SCr of 0.77 mg/dL).   Medical History: Past Medical History:  Diagnosis Date  . Asthma   . Platelet disorder (HCC)     Medications:  Scheduled:  . atorvastatin  40 mg Oral Daily  . methylPREDNISolone (SOLU-MEDROL) injection  40 mg Intravenous Q8H  . 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 Patient has trops of 2554>>3953>>6,726>>7,452 and EKG showing ST elevation suggesting acute myocarditis, CXR/CT WNL. Baseline CBC WNL, aPTT/INR pending. No anticoagulation PTA, patient is being started on heparin drip for management of NSTEMI s/t acute myocarditis possibly.  0422 0725 HL 0.60 No change 0422 1446 HL 1.61  Goal of Therapy:  Heparin level 0.3-0.7 units/ml Monitor platelets by anticoagulation  protocol: Yes   Plan:  Heparin level is supratherapeutic.Will hold heparin for 1 hours and decrease rate to 1050 units/hr. Will check anti-Xa in 6 hours. Will monitor daily CBC's and adjust per anti-Xa levels.  11/20/19, PharmD, BCPS 11/18/2019,4:05 PM

## 2019-11-18 NOTE — ED Notes (Signed)
Pt given breakfast tray and eating at this time. °

## 2019-11-18 NOTE — Progress Notes (Signed)
PHARMACY -  BRIEF ANTIBIOTIC NOTE   Pharmacy has received consult(s) for vancomycin from an ED provider.  The patient's profile has been reviewed for ht/wt/allergies/indication/available labs.    One time order(s) placed for vancomycin 2g IV load  Further antibiotics/pharmacy consults should be ordered by admitting physician if indicated.                       Thank you,  Thomasene Ripple, PharmD, BCPS Clinical Pharmacist 11/18/2019  2:59 AM

## 2019-11-18 NOTE — ED Notes (Signed)
Verbal order for morphine X2 if first does doesn't help per MD Paraschos.

## 2019-11-18 NOTE — ED Notes (Signed)
Heparin paused for 1 hour per pharmacy due to levels being outside of therapeutic range.

## 2019-11-18 NOTE — ED Notes (Signed)
Elevated troponin - 2554. MD aware

## 2019-11-18 NOTE — Progress Notes (Signed)
ANTICOAGULATION CONSULT NOTE - Initial Consult  Pharmacy Consult for heparin Indication: chest pain/ACS  Allergies  Allergen Reactions  . Motrin [Ibuprofen] Other (See Comments)    Low platlets    Patient Measurements: Height: 6\' 2"  (188 cm) Weight: 86.2 kg (190 lb) IBW/kg (Calculated) : 82.2 Heparin Dosing Weight: 86.2 kg  Vital Signs: Temp: 98.3 F (36.8 C) (04/21 2349) Temp Source: Oral (04/21 2349) BP: 126/93 (04/22 0206) Pulse Rate: 83 (04/22 0206)  Labs: Recent Labs    11/17/19 2359 11/18/19 0109  HGB 13.2  --   HCT 39.9  --   PLT 80*  --   CREATININE 0.89  --   TROPONINIHS 2,554* 3,953*    Estimated Creatinine Clearance: 150.1 mL/min (by C-G formula based on SCr of 0.89 mg/dL).   Medical History: Past Medical History:  Diagnosis Date  . Asthma   . Platelet disorder (HCC)     Medications:  Scheduled:  . 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 Patient has trops of 2554 >> 3953 and EKG showing ST elevation suggesting acute pericarditis, CXR/CT WNL. Baseline CBC WNL, aPTT/INR pending. No anticoagulation PTA, patient is being started on heparin drip for management of NSTEMI s/t acute pericarditis possibly.  Goal of Therapy:  Heparin level 0.3-0.7 units/ml Monitor platelets by anticoagulation protocol: Yes   Plan:  Will bolus heparin 4000 units IV x 1 Will start rate at 1200 units/hr. Will check anti-Xa at 0800. Will monitor daily CBC's and adjust per anti-Xa levels.  11/20/19, PharmD, BCPS Clinical Pharmacist 11/18/2019,2:18 AM

## 2019-11-18 NOTE — Progress Notes (Signed)
Pharmacy Antibiotic Note  Antonio Kaufman is a 24 y.o. male admitted on 11/18/2019 with acute bacterial myocarditis.  Pharmacy has been consulted for vancomycin dosing.  Plan: Patient received vanc 2g IV load in ED  Vancomycin 2000 mg IV Q 12 hrs. Goal AUC 400-550. Expected AUC: 499.5 SCr used: 0.89 Cssmin: 11.3  Will continue to monitor and adjust as needed per renal function and/or changing clinical scenario.  Height: 6\' 2"  (188 cm) Weight: 86.2 kg (190 lb) IBW/kg (Calculated) : 82.2  Temp (24hrs), Avg:98.3 F (36.8 C), Min:98.3 F (36.8 C), Max:98.3 F (36.8 C)  Recent Labs  Lab 11/17/19 2359 11/18/19 0442  WBC 10.0 11.7*  CREATININE 0.89  --     Estimated Creatinine Clearance: 150.1 mL/min (by C-G formula based on SCr of 0.89 mg/dL).    Allergies  Allergen Reactions  . Motrin [Ibuprofen] Other (See Comments)    Low platlets    Thank you for allowing pharmacy to be a part of this patient's care.  11/20/19, PharmD, BCPS Clinical Pharmacist 11/18/2019 5:44 AM

## 2019-11-18 NOTE — Consult Note (Addendum)
CARDIOLOGY CONSULT NOTE               Patient ID: Antonio Kaufman MRN: [id number] DOB/AGE: [birth year] 24 y.o.  Admit date: 11/18/2019 Referring Physician Priscella Mann, MD Primary Physician unknown Primary Cardiologist none Reason for Consultation chest pain, elevated troponin  HPI: 24 year old male referred for evaluation of chest pain and elevated troponin. The patient has a history of asthma and chronic thrombocytopenia. He is status post Pfizer Covid-19 vaccine on Saturday, 4/17. Several hours after receiving the vaccine, the patient experienced dizziness, headaches, followed by fever, and myalgias. Fever resolved the next day, though all other symptoms persisted until about Tuesday, 4/20. On Wednesday morning, the patient awoke around 6 AM with centralized chest pain and tightness with associated shortness of breath, which he likened to previous asthma attacks. He had no relief with inhaler use, the chest pain persisted into the night, so he presented to [facility] ER for further evaluation. Initial ECG revealed normal sinus rhythm at a rate of 96 bpm without acute ST-T wave abnormalities, without evidence of pericarditis. Repeat ECG revealed sinus rhythm with T wave inversion in lead III, and up-scooping concave ST elevation in aVL. Admission labs were notable for high sensitivity troponin 2554, 3953, 6726, 7452, WBC 11.7, and negative Covid and influenza. Chest CT negative for acute cardiopulmonary disease or pulmonary embolus. The patient was started on IV heparin, steroids, and antibiotics. The patient denies a known family history of heart disease or sudden cardiac death. He had relief of symptoms with morphine, but states that his pain is progressively worsening at about a 6/10 in intensity. He states he is trying to take shallow breaths as breathing heavier causes chest pain. He has some chest pain when lying at an incline in bed, but states it is worse when upright.  Review of systems complete and  found to be negative unless listed above     Past Medical History:  Diagnosis Date  . Asthma   . 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 No pertinent surgical history.  (Not in a hospital admission)  Social History   Socioeconomic History  . Marital status: Single    Spouse name: Not on file  . Number of children: Not on file  . Years of education: Not on file  . Highest education level: Not on file  Occupational History  . Not on file  Tobacco Use  . Smoking status: Never Smoker  . Smokeless tobacco: Never Used  Substance and Sexual Activity  . Alcohol use: No  . Drug use: No  . Sexual activity: Not on file  Other Topics Concern  . Not on file  Social History Narrative  . Not on file   Social Determinants of Health   Financial Resource Strain:   . Difficulty of Paying Living Expenses:   Food Insecurity:   . Worried About Charity fundraiser in the Last Year:   . Arboriculturist in the Last Year:   Transportation Needs:   . Film/video editor (Medical):   Marland Kitchen Lack of Transportation (Non-Medical):   Physical Activity:   . Days of Exercise per Week:   . Minutes of Exercise per Session:   Stress:   . Feeling of Stress :   Social Connections:   . Frequency of Communication with Friends and Family:   . Frequency of Social Gatherings with Friends and Family:   . Attends Religious Services:   . Active Member of Clubs or Organizations:   .  Attends Banker Meetings:   Marland Kitchen Marital Status:   Intimate Partner Violence:   . Fear of Current or Ex-Partner:   . Emotionally Abused:   Marland Kitchen Physically Abused:   . Sexually Abused:     No family history on file.    Review of systems complete and found to be negative unless listed above      PHYSICAL EXAM  General: Well developed, well nourished, in no acute distress, lying in bed, appears comfortable HEENT:  Normocephalic and atramatic Neck:  No JVD.  Lungs: Clear bilaterally to auscultation,  normal effort of breathing Heart: HRRR . Normal S1 and S2 without gallops or murmurs.  Abdomen: nondistended Msk:  Back normal, gait not assessed. Extremities: No clubbing, cyanosis or edema.   Neuro: Alert and oriented X 3. Psych:  Good affect, responds appropriately  Labs:   Lab Results  Component Value Date   WBC 11.7 (H) 11/18/2019   HGB 12.9 (L) 11/18/2019   HCT 38.9 (L) 11/18/2019   MCV 85.3 11/18/2019   PLT 81 (L) 11/18/2019    Recent Labs  Lab 11/17/19 2359 11/17/19 2359 11/18/19 0442  NA 138   < > 139  K 3.7   < > 3.7  CL 100   < > 104  CO2 27   < > 27  BUN 13   < > 10  CREATININE 0.89   < > 0.77  CALCIUM 9.5   < > 9.1  PROT 7.6  --   --   BILITOT 0.6  --   --   ALKPHOS 67  --   --   ALT 21  --   --   AST 31  --   --   GLUCOSE 115*   < > 133*   < > = values in this interval not displayed.   No results found for: CKTOTAL, CKMB, CKMBINDEX, TROPONINI  Lab Results  Component Value Date   CHOL 151 11/18/2019   Lab Results  Component Value Date   HDL 57 11/18/2019   Lab Results  Component Value Date   LDLCALC 87 11/18/2019   Lab Results  Component Value Date   TRIG 33 11/18/2019   Lab Results  Component Value Date   CHOLHDL 2.6 11/18/2019   No results found for: LDLDIRECT    Radiology: DG Chest 2 View  Result Date: 11/18/2019 CLINICAL DATA:  Mid chest pain and shortness of breath. EXAM: CHEST - 2 VIEW COMPARISON:  None. FINDINGS: The cardiomediastinal contours are normal. The lungs are clear. Pulmonary vasculature is normal. No consolidation, pleural effusion, or pneumothorax. No acute osseous abnormalities are seen. IMPRESSION: Negative radiographs of the chest. Electronically Signed   By: Narda Rutherford M.D.   On: 11/18/2019 00:25   CT Angio Chest PE W and/or Wo Contrast  Result Date: 11/18/2019 CLINICAL DATA:  Chest pain, shortness of breath EXAM: CT ANGIOGRAPHY CHEST WITH CONTRAST TECHNIQUE: Multidetector CT imaging of the chest was  performed using the standard protocol during bolus administration of intravenous contrast. Multiplanar CT image reconstructions and MIPs were obtained to evaluate the vascular anatomy. CONTRAST:  42mL OMNIPAQUE IOHEXOL 350 MG/ML SOLN COMPARISON:  None. FINDINGS: Cardiovascular: No filling defects in the pulmonary arteries. Heart is normal size. Aorta is normal caliber. Mediastinum/Nodes: No mediastinal, hilar, or axillary adenopathy. Trachea and esophagus are unremarkable. Thyroid unremarkable. Lungs/Pleura: Lungs are clear. No focal airspace opacities or suspicious nodules. No effusions. Upper Abdomen: Imaging into the upper abdomen shows no acute  findings. Musculoskeletal: Chest wall soft tissues are unremarkable. No acute bony abnormality. Review of the MIP images confirms the above findings. IMPRESSION: No acute cardiopulmonary disease. No evidence of pulmonary embolus. Electronically Signed   By: Charlett Nose M.D.   On: 11/18/2019 01:55    EKG: normal sinus rhythm, 90s  ASSESSMENT AND PLAN:  1. Chest pain, with some pleuritic component and associated shortness of breath, with preceding viral syndrome symptoms preceding, though following Covid-19 vaccine #2, with up-trending high sensitivity troponin, concerning for myocarditis vs myopericarditis. Chest CT negative for PE. ECG without evidence of ischemia. Repeat ECG revealed sinus rhythm with T wave inversion in lead III, and concave ST elevation in aVL.   2. Chronic asthma 3. Chronic thrombocytopenia  Plan: 1. Continue to trend troponin 2. Check CRP 3. 2D echocardiogram 4. Continue heparin drip for now pending results of echo 5. No indication for cardiac catheterization at this time. 6. Morphine and tylenol PRN for pain 7. Further recommendations pending patient's initial course  Signed: Rockie Neighbours 11/18/2019, 9:43 AM  The patient was also evaluated by Dr. Darrold Junker, and the plan was made in collaboration with him.

## 2019-11-18 NOTE — ED Triage Notes (Signed)
Patient ambulatory to triage with steady gait, without difficulty or distress noted, mask in place; pt reports since yesterday having mid CP, nonradiating accomp by [facility]; denies hx of same

## 2019-11-18 NOTE — ED Notes (Signed)
Admit MD at bedside

## 2019-11-18 NOTE — H&P (Signed)
[facility]   PATIENT NAME: Antonio Kaufman    MR#:  [id number]  DATE OF BIRTH:  10-19-1995  DATE OF ADMISSION:  11/18/2019  PRIMARY CARE PHYSICIAN: System, Pcp Not In   REQUESTING/REFERRING PHYSICIAN: Bayard Males, MD  CHIEF COMPLAINT:   Chief Complaint  Patient presents with  . Chest Pain    HISTORY OF PRESENT ILLNESS:  Lenville Hibberd  is a 24 y.o. Caucasian pleasant male with a known history of controlled asthma and chronic thrombocytopenia, presented to the emergency room with acute onset of chest pain in the midsternal area that started at 6 AM and associated left arm pain started 7:30 PM.  He also admitted to neck pain since yesterday.  He felt he had a fever will not check it and denies any cough wheezing.  He admits to dyspnea without palpitations.  He just had his second [employer] vaccine on Sunday and denied any COVID-19 recent exposure.  He stated he had several side effects with that including dizziness, headache, migraine, cough, unsteady gait and balance loss as well as fever and chills with diaphoresis.  Upon presentation to the emergency room, vital signs were within normal.  Labs were remarkable for high-sensitivity troponin I of 2554 and later 3953 with normal CMP and CBC except for thrombocytopenia of 80 comparable to previous levels.  COVID-19 PCR and influenza antigens came back all negative.  2 blood cultures were drawn.  Two-view chest x-ray showed no acute cardiopulmonary disease.  Chest CTA revealed no evidence for PE or acute cardiopulmonary disease.  The patient was given IV Rocephin and vancomycin as well as 2 mg of IV morphine sulfate and hematology consultation was obtained by Dr. Cathie Hoops over the phone who was agreeable with IV heparin bolus and drip.  The patient will be admitted to a progressive cardiac unit bed for further evaluation and management. PAST MEDICAL HISTORY:   Past Medical History:  Diagnosis Date  . Asthma   .  Platelet disorder (HCC)     PAST SURGICAL HISTORY:  History reviewed. No pertinent surgical history.  He denied any previous surgeries.  SOCIAL HISTORY:   Social History   Tobacco Use  . Smoking status: Never Smoker  . Smokeless tobacco: Never Used  Substance Use Topics  . Alcohol use: No    FAMILY HISTORY:   Positive for diabetes mellitus in his grandfather and lymphoma.   DRUG ALLERGIES:   Allergies  Allergen Reactions  . Motrin [Ibuprofen] Other (See Comments)    Low platlets    REVIEW OF SYSTEMS:   ROS As per history of present illness. All pertinent systems were reviewed above. Constitutional,  HEENT, cardiovascular, respiratory, GI, GU, musculoskeletal, neuro, psychiatric, endocrine,  integumentary and hematologic systems were reviewed and are otherwise  negative/unremarkable except for positive findings mentioned above in the HPI.   MEDICATIONS AT HOME:   Prior to Admission medications   Medication Sig Start Date End Date Taking? Authorizing Provider  ondansetron (ZOFRAN) 4 MG tablet Take 1 tablet (4 mg total) by mouth every 8 (eight) hours as needed for nausea or vomiting. 09/08/16   Phineas Semen, MD      VITAL SIGNS:  Blood pressure (!) 126/93, pulse 83, temperature 98.3 F (36.8 C), temperature source Oral, resp. rate 14, height 6\' 2"  (1.88 m), weight 86.2 kg, SpO2 98 %.  PHYSICAL EXAMINATION:  Physical Exam  GENERAL:  24 y.o.-year-old Caucasian male patient lying in the bed with no acute distress.  EYES: Pupils equal, round, reactive to light and accommodation. No scleral icterus. Extraocular muscles intact.  HEENT: Head atraumatic, normocephalic. Oropharynx and nasopharynx clear.  NECK:  Supple, no jugular venous distention. No thyroid enlargement, no tenderness.  LUNGS: Normal breath sounds bilaterally, no wheezing, rales,rhonchi or crepitation. No use of accessory muscles of respiration.  CARDIOVASCULAR: Regular rate and rhythm, S1, S2 normal.  No murmurs, rubs, or gallops.  ABDOMEN: Soft, nondistended, nontender. Bowel sounds present. No organomegaly or mass.  EXTREMITIES: No pedal edema, cyanosis, or clubbing.  NEUROLOGIC: Cranial nerves II through XII are intact. Muscle strength 5/5 in all extremities. Sensation intact. Gait not checked.  PSYCHIATRIC: The patient is alert and oriented x 3.  Normal affect and good eye contact. SKIN: No obvious rash, lesion, or ulcer.   LABORATORY PANEL:   CBC Recent Labs  Lab 11/17/19 2359  WBC 10.0  HGB 13.2  HCT 39.9  PLT 80*   ------------------------------------------------------------------------------------------------------------------  Chemistries  Recent Labs  Lab 11/17/19 2359  NA 138  K 3.7  CL 100  CO2 27  GLUCOSE 115*  BUN 13  CREATININE 0.89  CALCIUM 9.5  AST 31  ALT 21  ALKPHOS 67  BILITOT 0.6   ------------------------------------------------------------------------------------------------------------------  Cardiac Enzymes No results for input(s): TROPONINI in the last 168 hours. ------------------------------------------------------------------------------------------------------------------  RADIOLOGY:  DG Chest 2 View  Result Date: 11/18/2019 CLINICAL DATA:  Mid chest pain and shortness of breath. EXAM: CHEST - 2 VIEW COMPARISON:  None. FINDINGS: The cardiomediastinal contours are normal. The lungs are clear. Pulmonary vasculature is normal. No consolidation, pleural effusion, or pneumothorax. No acute osseous abnormalities are seen. IMPRESSION: Negative radiographs of the chest. Electronically Signed   By: Keith Rake M.D.   On: 11/18/2019 00:25   CT Angio Chest PE W and/or Wo Contrast  Result Date: 11/18/2019 CLINICAL DATA:  Chest pain, shortness of breath EXAM: CT ANGIOGRAPHY CHEST WITH CONTRAST TECHNIQUE: Multidetector CT imaging of the chest was performed using the standard protocol during bolus administration of intravenous contrast.  Multiplanar CT image reconstructions and MIPs were obtained to evaluate the vascular anatomy. CONTRAST:  76mL OMNIPAQUE IOHEXOL 350 MG/ML SOLN COMPARISON:  None. FINDINGS: Cardiovascular: No filling defects in the pulmonary arteries. Heart is normal size. Aorta is normal caliber. Mediastinum/Nodes: No mediastinal, hilar, or axillary adenopathy. Trachea and esophagus are unremarkable. Thyroid unremarkable. Lungs/Pleura: Lungs are clear. No focal airspace opacities or suspicious nodules. No effusions. Upper Abdomen: Imaging into the upper abdomen shows no acute findings. Musculoskeletal: Chest wall soft tissues are unremarkable. No acute bony abnormality. Review of the MIP images confirms the above findings. IMPRESSION: No acute cardiopulmonary disease. No evidence of pulmonary embolus. Electronically Signed   By: Rolm Baptise M.D.   On: 11/18/2019 01:55      IMPRESSION AND PLAN:   1.  Chest pain with significantly elevated troponin I, highly concerning concerning for acute myocarditis likely viral, with possible side effect to COVID-19 vaccine and other differential would include less likely bacterial etiology. -The patient will be admitted to a progressive cardiac unit bed. -We will place him on IV steroids that were started in the ER. -We we will continue IV heparin as per hematology consultation recommendation. -Pain management will be provided with as needed IV morphine sulfate. -We will continue antibiotic therapy with IV Rocephin and vancomycin pending cultures results. -Cardiology consultation and 2D echo will be obtained. -I notified Dr. Nehemiah Massed about the patient. -We will follow serial troponin I's.  2.  Stable uncontrolled asthma. -As  needed duo nebs will be provided.  3.  DVT prophylaxis. -IV s heparin drip  All the records are reviewed and case discussed with ED provider. The plan of care was discussed in details with the patient (and family). I answered all questions. The  patient agreed to proceed with the above mentioned plan. Further management will depend upon hospital course.   CODE STATUS: Full code  Status is: Inpatient  Remains inpatient appropriate because:Ongoing active pain requiring inpatient pain management, Unsafe d/c plan, IV treatments appropriate due to intensity of illness or inability to take PO and Inpatient level of care appropriate due to severity of illness   Dispo: The patient is from: Home              Anticipated d/c is to: Home              Anticipated d/c date is: 2 days              Patient currently is not medically stable to d/c.     TOTAL TIME TAKING CARE OF THIS PATIENT: 55 minutes.    Hannah Beat M.D on 11/18/2019 at 2:26 AM  [facility]   From 7 PM-7 AM, contact night-coverage [email or web address]  CC: Primary care physician; System, Pcp Not In   Note: This dictation was prepared with Dragon dictation along with smaller phrase technology. Any transcriptional errors that result from this process are unintentional.

## 2019-11-18 NOTE — ED Notes (Signed)
Pt given sandwich and drink.

## 2019-11-18 NOTE — ED Provider Notes (Signed)
[facility] Emergency Department Provider Note  ____________________________________________   First MD Initiated Contact with Patient 11/18/19 0106     (approximate)  I have reviewed the triage vital signs and the nursing notes.   HISTORY  Chief Complaint Chest Pain    HPI Antonio Kaufman is a 24 y.o. male with below list of previous medical conditions presents to the emergency department secondary to central chest pain/neck pain and left arm pain with associated dyspnea which patient states began yesterday.  Patient states that he noted improvement in the pain over the course of the day however that it worsened this evening.  Her current pain score of 9 out of 10.  Patient denies any lower extremity pain or swelling.  Of note patient states that he recently received his second [place] COVID-19 isolation on Saturday.  Patient states following the vaccination he did have fever and generalized body aches which improved.       Past Medical History:  Diagnosis Date  . Asthma   . 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   History reviewed. No pertinent surgical history.  Prior to Admission medications   Medication Sig Start Date End Date Taking? Authorizing Provider  ondansetron (ZOFRAN) 4 MG tablet Take 1 tablet (4 mg total) by mouth every 8 (eight) hours as needed for nausea or vomiting. 09/08/16   Nance Pear, MD    Allergies Motrin [ibuprofen]  No family history on file.  Social History Social History   Tobacco Use  . Smoking status: Never Smoker  . Smokeless tobacco: Never Used  Substance Use Topics  . Alcohol use: No  . Drug use: No    Review of Systems Constitutional: No fever/chills Eyes: No visual changes. ENT: No sore throat. Cardiovascular: Denies chest pain. Respiratory: Denies shortness of breath. Gastrointestinal: No abdominal pain.  No nausea, no vomiting.  No diarrhea.  No  constipation. Genitourinary: Negative for dysuria. Musculoskeletal: Negative for neck pain.  Negative for back pain. Integumentary: Negative for rash. Neurological: Negative for headaches, focal weakness or numbness.  ____________________________________________   PHYSICAL EXAM:  VITAL SIGNS: ED Triage Vitals  Enc Vitals Group     BP 11/17/19 2349 135/90     Pulse Rate 11/17/19 2349 93     Resp 11/17/19 2349 20     Temp 11/17/19 2349 98.3 F (36.8 C)     Temp Source 11/17/19 2349 Oral     SpO2 11/17/19 2349 100 %     Weight 11/17/19 2349 86.2 kg (190 lb)     Height 11/17/19 2349 1.88 m (6\' 2" )     Head Circumference --      Peak Flow --      Pain Score 11/18/19 0003 9     Pain Loc --      Pain Edu? --      Excl. in [place]? --     Constitutional: Alert and oriented.  Eyes: Conjunctivae are normal.  Mouth/Throat: Patient is wearing a mask. Neck: No stridor.  No meningeal signs.   Cardiovascular: Normal rate, regular rhythm. Good peripheral circulation. Grossly normal heart sounds. Respiratory: Normal respiratory effort.  No retractions. Gastrointestinal: Soft and nontender. No distention.  Musculoskeletal: No lower extremity tenderness nor edema. No gross deformities of extremities. Neurologic:  Normal speech and language. No gross focal neurologic deficits are appreciated.  Skin:  Skin is warm, dry and intact. Psychiatric: Mood and affect are normal. Speech  and behavior are normal.  ____________________________________________   LABS (all labs ordered are listed, but only abnormal results are displayed)  Labs Reviewed  COMPREHENSIVE METABOLIC PANEL - Abnormal; Notable for the following components:      Result Value   Glucose, Bld 115 (*)    All other components within normal limits  TROPONIN I (HIGH SENSITIVITY) - Abnormal; Notable for the following components:   Troponin I (High Sensitivity) 2,554 (*)    All other components within normal limits  CULTURE, BLOOD  (ROUTINE X 2)  CULTURE, BLOOD (ROUTINE X 2)  CBC WITH DIFFERENTIAL/PLATELET  TROPONIN I (HIGH SENSITIVITY)   ____________________________________________  EKG  ED ECG REPORT I, [place] N Chyann Ambrocio, the attending physician, personally viewed and interpreted this ECG.   Date: 11/17/2019  EKG Time: 11:52PM  Rate: 96  Rhythm: Normal sinus rhythm  Axis: Normal  Intervals: Normal  ST&T Change: None  ED ECG REPORT I, [place] N Sarahmarie Leavey, the attending physician, personally viewed and interpreted this ECG.   Date: 11/18/2019  EKG Time: 12:55 AM  Rate: 91  Rhythm: Normal sinus rhythm  Axis: Normal  Intervals: Normal  ST&T Change: None  ____________________________________________  RADIOLOGY I, [place] N Shakeia Krus, personally viewed and evaluated these images (plain radiographs) as part of my medical decision making, as well as reviewing the written report by the radiologist.  ED MD interpretation: Negative chest x-ray per radiologist.  Official radiology report(s): DG Chest 2 View  Result Date: 11/18/2019 CLINICAL DATA:  Mid chest pain and shortness of breath. EXAM: CHEST - 2 VIEW COMPARISON:  None. FINDINGS: The cardiomediastinal contours are normal. The lungs are clear. Pulmonary vasculature is normal. No consolidation, pleural effusion, or pneumothorax. No acute osseous abnormalities are seen. IMPRESSION: Negative radiographs of the chest. Electronically Signed   By: Narda Rutherford M.D.   On: 11/18/2019 00:25     .Critical Care Performed by: Darci Current, MD Authorized by: Darci Current, MD   Critical care provider statement:    Critical care time (minutes):  30   Critical care time was exclusive of:  Separately billable procedures and treating other patients   Critical care was necessary to treat or prevent imminent or life-threatening deterioration of the following conditions:  Cardiac failure and circulatory failure   Critical care was time spent personally by me  on the following activities:  Development of treatment plan with patient or surrogate, discussions with consultants, evaluation of patient's response to treatment, examination of patient, obtaining history from patient or surrogate, ordering and performing treatments and interventions, ordering and review of laboratory studies, ordering and review of radiographic studies, pulse oximetry, re-evaluation of patient's condition and review of old charts     ____________________________________________   INITIAL IMPRESSION / MDM / ASSESSMENT AND PLAN / ED COURSE  As part of my medical decision making, I reviewed the following data within the electronic MEDICAL RECORD NUMBER  24 year old male presented with above-stated history and physical exam a differential diagnosis including ACS, myocarditis, pericarditis endocarditis, also considered possibility of pulmonary emboli.  EKG revealed no evidence of ischemia or infarction.  EKG also does not reveal any evidence of pericarditis.  Patient's initial troponin 2554.  Patient discussed with Dr. Cathie Hoops hematologist on-call to discuss heparinization of the patient and given low platelet counts of 80.  Dr. Cathie Hoops recommended to go ahead with heparin if it was medically indicated.  As such heparin was initiated.  Suspect possible myocarditis as the etiology for the patient's symptoms and laboratory  data blood cultures were obtained.  Patient discussed with Dr. Arville Care for hospital admission for further evaluation and management.   FINAL CLINICAL IMPRESSION(S) / ED DIAGNOSES  Final diagnoses:  Elevated troponin  Suspected myocarditis   MEDICATIONS GIVEN DURING THIS VISIT:  Medications - No data to display   ED Discharge Orders    None      *Please note:  Josedaniel Haye was evaluated in Emergency Department on 11/18/2019 for the symptoms described in the history of present illness. He was evaluated in the context of the global COVID-19 pandemic, which necessitated  consideration that the patient might be at risk for infection with the SARS-CoV-2 virus that causes COVID-19. Institutional protocols and algorithms that pertain to the evaluation of patients at risk for COVID-19 are in a state of rapid change based on information released by regulatory bodies including the CDC and federal and state organizations. These policies and algorithms were followed during the patient's care in the ED.  Some ED evaluations and interventions may be delayed as a result of limited staffing during the pandemic.*  Note:  This document was prepared using Dragon voice recognition software and may include unintentional dictation errors.   Darci Current, MD 11/18/19 [phone]

## 2019-11-18 NOTE — Progress Notes (Signed)
ANTICOAGULATION CONSULT NOTE - Initial Consult  Pharmacy Consult for heparin Indication: chest pain/ACS  Allergies  Allergen Reactions  . Motrin [Ibuprofen] Other (See Comments)    Low platlets    Patient Measurements: Height: 6\' 2"  (188 cm) Weight: 86.2 kg (190 lb) IBW/kg (Calculated) : 82.2 Heparin Dosing Weight: 86.2 kg  Vital Signs: Temp: 98.3 F (36.8 C) (04/21 2349) Temp Source: Oral (04/21 2349) BP: 127/91 (04/22 0800) Pulse Rate: 103 (04/22 0800)  Labs: Recent Labs    11/17/19 2359 11/17/19 2359 11/18/19 0109 11/18/19 0220 11/18/19 0301 11/18/19 0442 11/18/19 0725  HGB 13.2  --   --   --   --  12.9*  --   HCT 39.9  --   --   --   --  38.9*  --   PLT 80*  --   --   --   --  81*  --   APTT  --   --   --  26  --   --   --   LABPROT  --   --   --  13.4  --   --   --   INR  --   --   --  1.0  --   --   --   HEPARINUNFRC  --   --   --   --   --   --  0.60  CREATININE 0.89  --   --   --   --  0.77  --   TROPONINIHS 2,554*   < > 3,953*  --  6,726* 7,452*  --    < > = values in this interval not displayed.    Estimated Creatinine Clearance: 167 mL/min (by C-G formula based on SCr of 0.77 mg/dL).   Medical History: Past Medical History:  Diagnosis Date  . Asthma   . Platelet disorder ([facility])     Medications:  Scheduled:  . atorvastatin  40 mg Oral Daily  . methylPREDNISolone (SOLU-MEDROL) injection  40 mg Intravenous Q8H  . 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 Patient has trops of 2554>>3953>>6,726>>7,452 and EKG showing ST elevation suggesting acute myocarditis, CXR/CT WNL. Baseline CBC WNL, aPTT/INR pending. No anticoagulation PTA, patient is being started on heparin drip for management of NSTEMI s/t acute myocarditis possibly.  0422 0725 HL 0.60  Goal of Therapy:  Heparin level 0.3-0.7 units/ml Monitor platelets by anticoagulation protocol: Yes    Plan:  HL 0.60 therapeutic x 1  Will continue rate at 1200 units/hr. Will check anti-Xa at 1400. Will monitor daily CBC's and adjust per anti-Xa levels.  11/20/19 PharmD Student  11/18/2019,9:11 AM

## 2019-11-18 NOTE — ED Notes (Signed)
This RN attempted call report x2 to receiving RN Rodman Pickle at [facility] with no success.

## 2019-11-18 NOTE — ED Notes (Addendum)
Repeat EKG completed and Admit MD notified.  MD Monks shown EKG

## 2019-11-18 NOTE — Progress Notes (Signed)
The patient has had no arrhthymias. He is well-appearing, non-toxic appearing, sitting up eating lunch. Dr. Darrold Junker discussed the case with cardiologist, Dr. Lupita Shutter, at [facility] who feels the patient should be transferred to [facility] for cardiac MRI to assess extent of inflammation.  Accepting physician is Dr. Ardyth Harps. Will plan to continue heparin drip through transfer.  2D echocardiogram revealed normal left ventricular function with LVEF 60-65% with grade I diastolic dysfunction with mild MR, without pericardial effusion.

## 2019-11-18 NOTE — ED Notes (Signed)
Images POWERSHARED by Lupita Leash in radiology

## 2019-11-18 NOTE — ED Notes (Signed)
Cardiology at bedside.

## 2019-11-18 NOTE — Progress Notes (Signed)
*  PRELIMINARY RESULTS* Echocardiogram 2D Echocardiogram has been performed.  Antonio Kaufman 11/18/2019, 12:00 PM

## 2019-11-18 NOTE — ED Notes (Signed)
Pt and family assisted on logging into MyChart and updates.

## 2019-11-18 NOTE — ED Provider Notes (Signed)
Repeat EKG done per admitting provider. 11/18/19 at 0933. Personally reviewed.   EKG appears to be sinus tachycardia, HR 104.  Normal axis.  Normal intervals. Question ~1 mm ST changes I, avL, appears similar to EKG at 0055. However, does have new ST flattening and TWI in III.   9:55 AM Dicussed EKG findings/changes with patient's admitting MD, Dr. Georgeann Oppenheim. He is aware, will review and will discuss w/ cardiology.    Miguel Aschoff., MD 11/18/19 [phone]

## 2019-11-20 MED ORDER — LISINOPRIL 5 MG PO TABS
5.00 | ORAL_TABLET | ORAL | Status: DC
Start: 2019-11-21 — End: 2019-11-20

## 2019-11-20 MED ORDER — ACETAMINOPHEN 325 MG PO TABS
650.00 | ORAL_TABLET | ORAL | Status: DC
Start: ? — End: 2019-11-20

## 2019-11-20 MED ORDER — ALBUTEROL SULFATE HFA 108 (90 BASE) MCG/ACT IN AERS
2.00 | INHALATION_SPRAY | RESPIRATORY_TRACT | Status: DC
Start: ? — End: 2019-11-20

## 2019-11-20 MED ORDER — COLCHICINE 0.6 MG PO TABS
0.60 | ORAL_TABLET | ORAL | Status: DC
Start: 2019-11-21 — End: 2019-11-20

## 2019-11-20 MED ORDER — MELATONIN 3 MG PO TABS
3.00 | ORAL_TABLET | ORAL | Status: DC
Start: 2019-11-21 — End: 2019-11-20

## 2019-11-23 LAB — CULTURE, BLOOD (ROUTINE X 2): Culture: NO GROWTH

## 2021-07-23 IMAGING — CT CT ANGIO CHEST
2 of 6 series · 19 of 46 positions shown · IV contrast (APPLIED)
Comparison: None.

CLINICAL DATA: Chest pain, shortness of breath

EXAM:
CT ANGIOGRAPHY CHEST WITH CONTRAST
TECHNIQUE: Multidetector CT imaging of the chest was performed using the
standard protocol during bolus administration of intravenous
contrast. Multiplanar CT image reconstructions and MIPs were
obtained to evaluate the vascular anatomy.
CONTRAST:  75mL OMNIPAQUE IOHEXOL 350 MG/ML SOLN

[Series 5: thins · axial · 0.70mm/px · z∈[-539,-283]mm · 17 of 282 slices shown]
[im 13/282  lung]
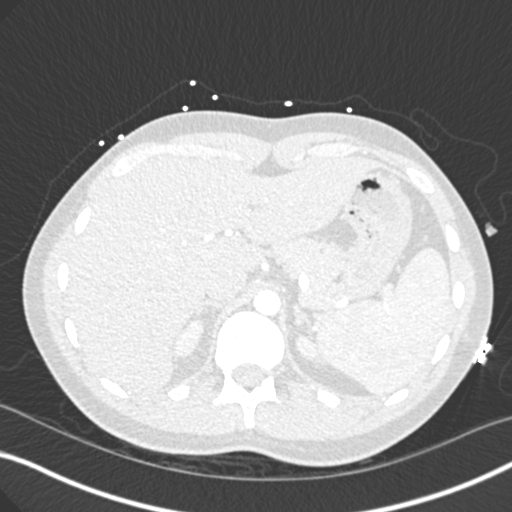
[im 25/282  soft-tissue]
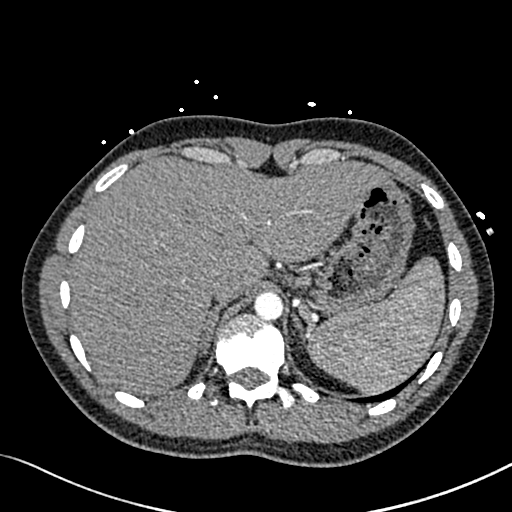
[im 49/282  lung]
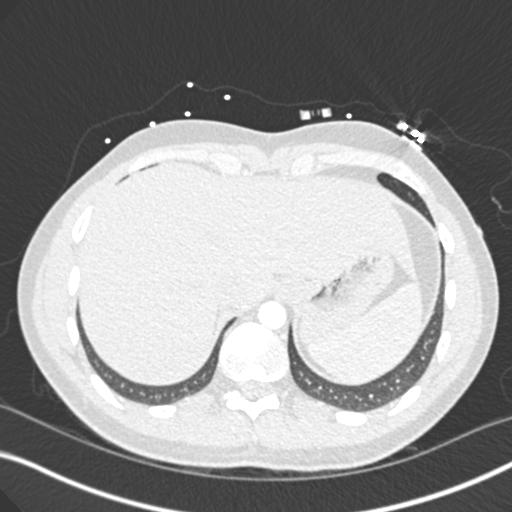
[im 62/282  soft-tissue]
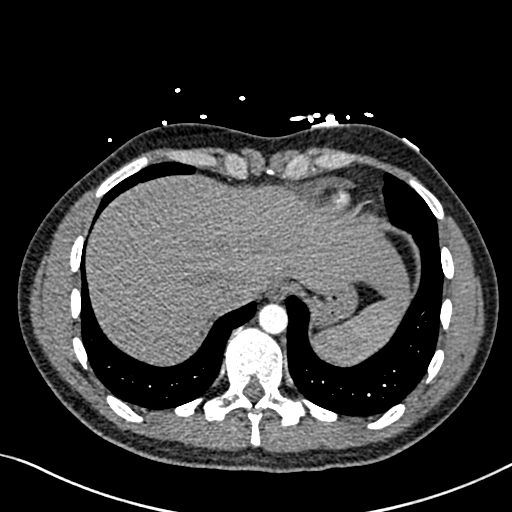
[im 74/282  lung]
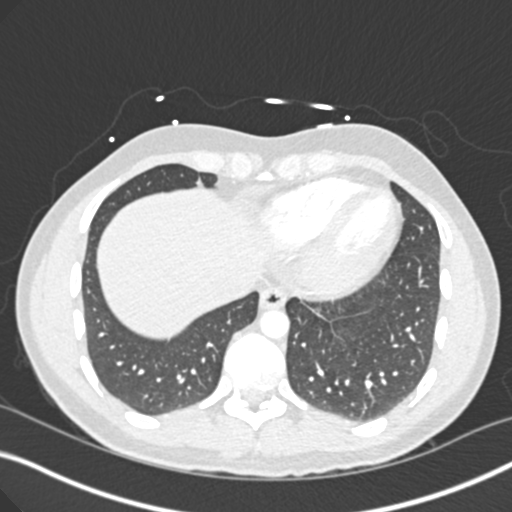
[im 98/282  soft-tissue]
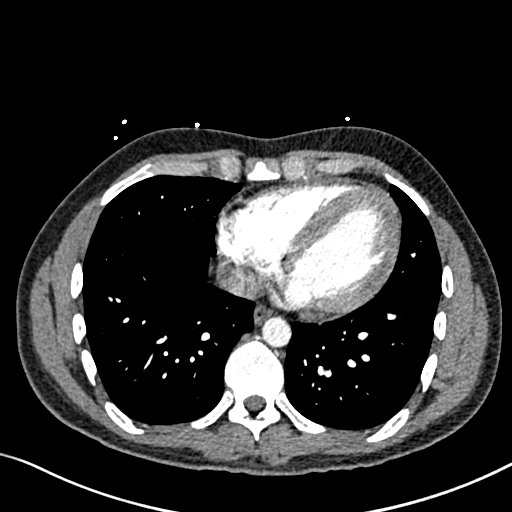
[im 110/282  lung]
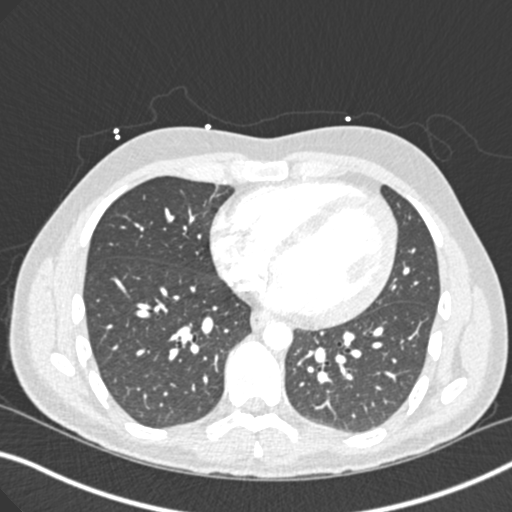
[im 123/282  soft-tissue]
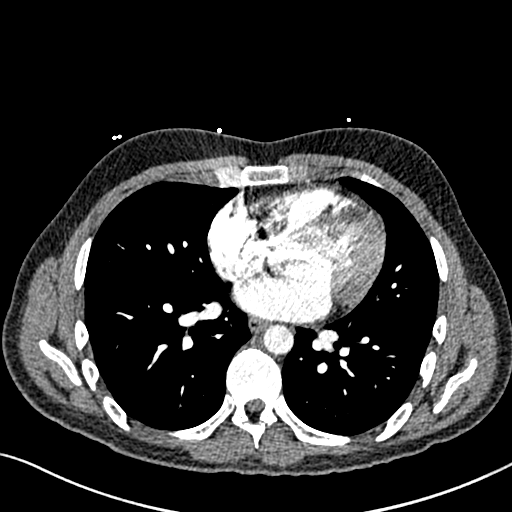
[im 147/282  lung]
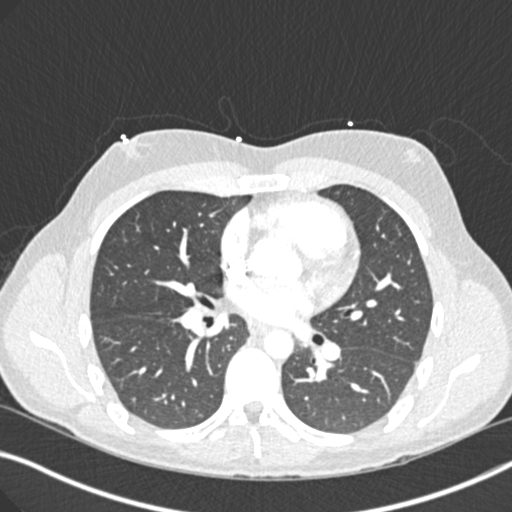
[im 159/282  soft-tissue]
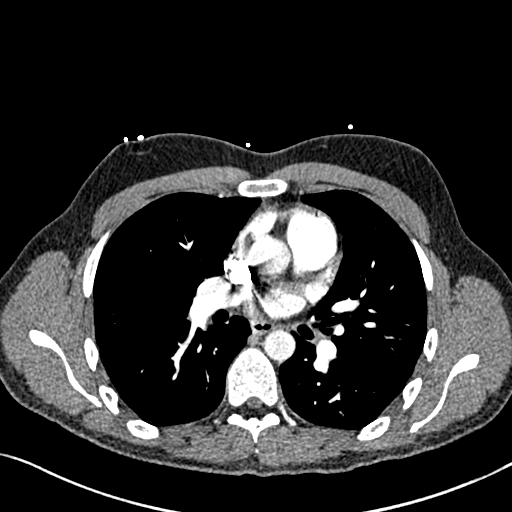
[im 172/282  lung]
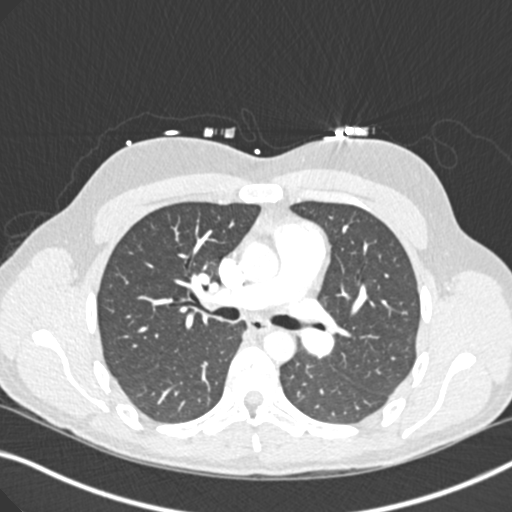
[im 184/282  soft-tissue]
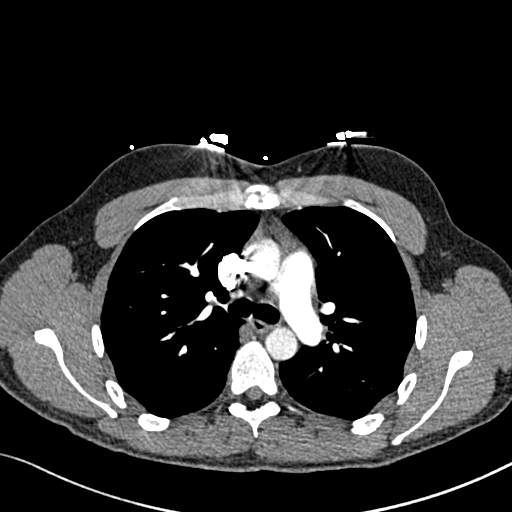
[im 208/282  lung]
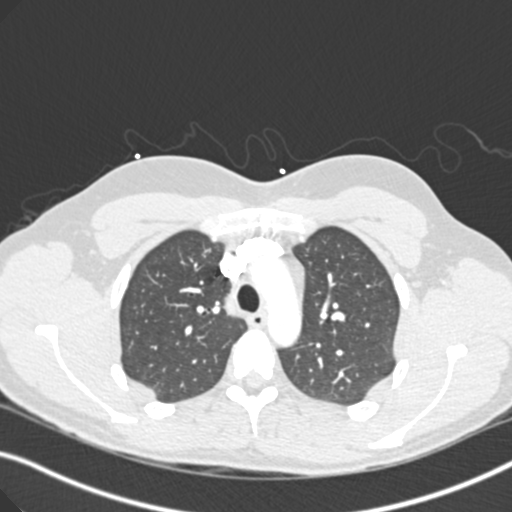
[im 220/282  soft-tissue]
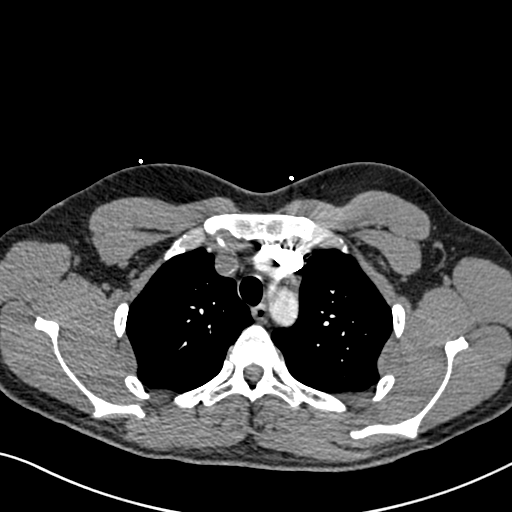
[im 233/282  lung]
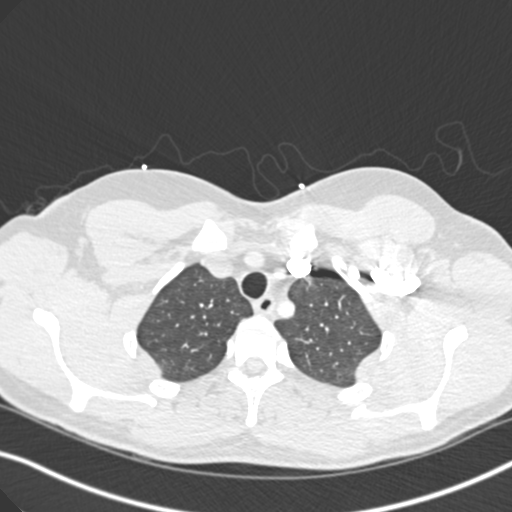
[im 257/282  soft-tissue]
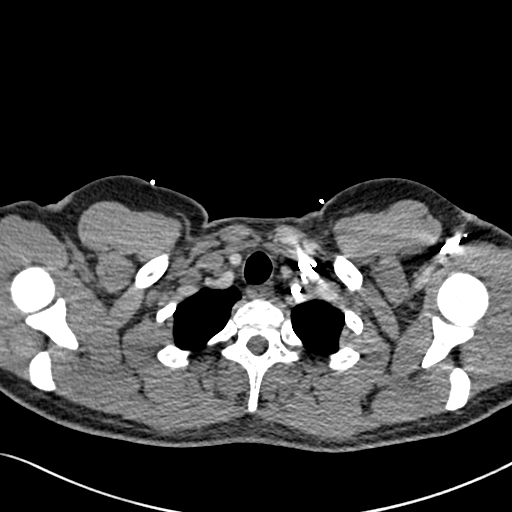
[im 269/282  lung]
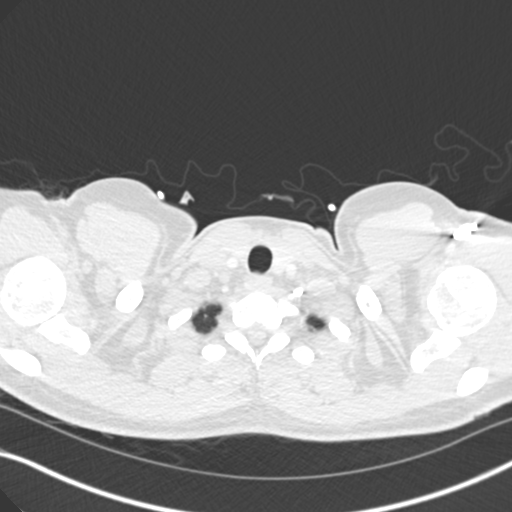

[Series 7: coronal mpr · coronal · 0.58mm/px · 2 of 81 slices shown]
[im 27/81  soft-tissue]
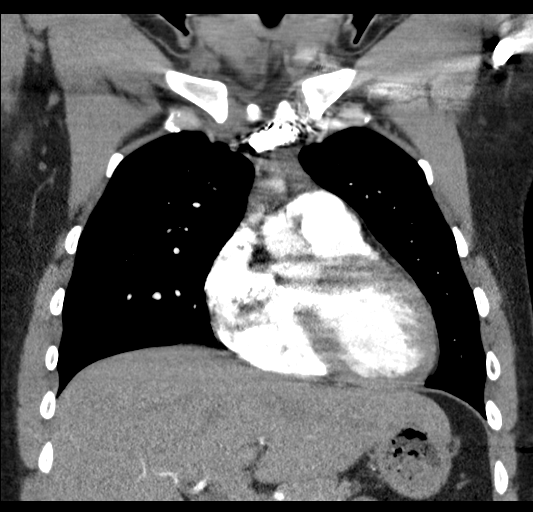
[im 54/81  soft-tissue]
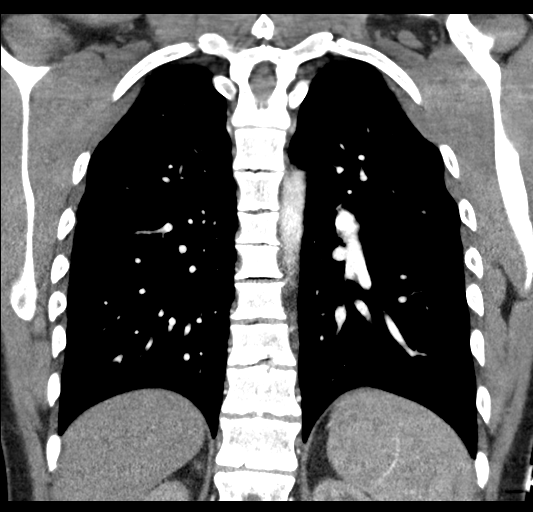

[19 of 46 positions shown; findings below may reference images not displayed]

FINDINGS: Cardiovascular: No filling defects in the pulmonary arteries. Heart
is normal size. Aorta is normal caliber.

Mediastinum/Nodes: No mediastinal, hilar, or axillary adenopathy.
Trachea and esophagus are unremarkable. Thyroid unremarkable.

Lungs/Pleura: Lungs are clear. No focal airspace opacities or
suspicious nodules. No effusions.

Upper Abdomen: Imaging into the upper abdomen shows no acute
findings.

Musculoskeletal: Chest wall soft tissues are unremarkable. No acute
bony abnormality.

Review of the MIP images confirms the above findings.
IMPRESSION: No acute cardiopulmonary disease.

No evidence of pulmonary embolus.
